# Patient Record
Sex: Female | Born: 1960 | Race: White | Hispanic: No | Marital: Married | Smoking: Former smoker
Health system: Southern US, Community
[De-identification: ages and names within clinical notes are randomized; demographics above are authoritative.]

## PROBLEM LIST (undated history)

## (undated) DIAGNOSIS — Z961 Presence of intraocular lens: Secondary | ICD-10-CM

## (undated) DIAGNOSIS — Z9849 Cataract extraction status, unspecified eye: Secondary | ICD-10-CM

## (undated) DIAGNOSIS — T8859XA Other complications of anesthesia, initial encounter: Secondary | ICD-10-CM

## (undated) DIAGNOSIS — R32 Unspecified urinary incontinence: Secondary | ICD-10-CM

## (undated) DIAGNOSIS — T7840XA Allergy, unspecified, initial encounter: Secondary | ICD-10-CM

## (undated) DIAGNOSIS — E785 Hyperlipidemia, unspecified: Secondary | ICD-10-CM

## (undated) DIAGNOSIS — K5790 Diverticulosis of intestine, part unspecified, without perforation or abscess without bleeding: Secondary | ICD-10-CM

## (undated) DIAGNOSIS — G35 Multiple sclerosis: Secondary | ICD-10-CM

## (undated) DIAGNOSIS — U071 COVID-19: Secondary | ICD-10-CM

## (undated) DIAGNOSIS — E559 Vitamin D deficiency, unspecified: Secondary | ICD-10-CM

## (undated) DIAGNOSIS — J309 Allergic rhinitis, unspecified: Secondary | ICD-10-CM

## (undated) HISTORY — DX: Diverticulosis of intestine, part unspecified, without perforation or abscess without bleeding: K57.90

## (undated) HISTORY — DX: Vitamin D deficiency, unspecified: E55.9

## (undated) HISTORY — DX: COVID-19: U07.1

## (undated) HISTORY — DX: Allergy, unspecified, initial encounter: T78.40XA

## (undated) HISTORY — DX: Unspecified urinary incontinence: R32

## (undated) HISTORY — PX: TUBAL LIGATION: SHX77

---

## 2000-02-08 ENCOUNTER — Other Ambulatory Visit: Admission: RE | Admit: 2000-02-08 | Discharge: 2000-02-08 | Payer: Self-pay | Admitting: Family Medicine

## 2004-11-03 HISTORY — PX: OTHER SURGICAL HISTORY: SHX169

## 2004-11-17 ENCOUNTER — Other Ambulatory Visit: Admission: RE | Admit: 2004-11-17 | Discharge: 2004-11-17 | Payer: Self-pay | Admitting: Obstetrics and Gynecology

## 2004-12-15 HISTORY — PX: ENDOMETRIAL BIOPSY: SHX622

## 2005-03-07 ENCOUNTER — Ambulatory Visit (HOSPITAL_BASED_OUTPATIENT_CLINIC_OR_DEPARTMENT_OTHER): Admission: RE | Admit: 2005-03-07 | Discharge: 2005-03-07 | Payer: Self-pay | Admitting: Obstetrics and Gynecology

## 2005-03-07 HISTORY — PX: ENDOMETRIAL ABLATION: SHX621

## 2006-04-07 ENCOUNTER — Other Ambulatory Visit: Admission: RE | Admit: 2006-04-07 | Discharge: 2006-04-07 | Payer: Self-pay | Admitting: Obstetrics and Gynecology

## 2006-11-04 HISTORY — PX: BREAST SURGERY: SHX581

## 2006-11-23 ENCOUNTER — Ambulatory Visit (HOSPITAL_BASED_OUTPATIENT_CLINIC_OR_DEPARTMENT_OTHER): Admission: RE | Admit: 2006-11-23 | Discharge: 2006-11-23 | Payer: Self-pay | Admitting: General Surgery

## 2006-11-23 ENCOUNTER — Encounter (INDEPENDENT_AMBULATORY_CARE_PROVIDER_SITE_OTHER): Payer: Self-pay | Admitting: General Surgery

## 2007-04-17 ENCOUNTER — Other Ambulatory Visit: Admission: RE | Admit: 2007-04-17 | Discharge: 2007-04-17 | Payer: Self-pay | Admitting: Obstetrics and Gynecology

## 2008-05-06 ENCOUNTER — Other Ambulatory Visit: Admission: RE | Admit: 2008-05-06 | Discharge: 2008-05-06 | Payer: Self-pay | Admitting: Obstetrics and Gynecology

## 2008-05-22 ENCOUNTER — Ambulatory Visit: Payer: Self-pay | Admitting: Internal Medicine

## 2008-06-03 LAB — HM COLONOSCOPY

## 2008-06-05 ENCOUNTER — Ambulatory Visit: Payer: Self-pay | Admitting: Internal Medicine

## 2009-05-13 LAB — HM PAP SMEAR

## 2010-05-18 NOTE — Op Note (Signed)
Gabrielle Patterson, Gabrielle Patterson               ACCOUNT NO.:  [id number]   MEDICAL RECORD NO.:  [id number]          PATIENT TYPE:  AMB   LOCATION:  [facility]SC                          FACILITY:  [facility]   PHYSICIAN:  Timothy E. Earlene Plater, M.D. DATE OF BIRTH:  December 06, 1960   DATE OF PROCEDURE:  11/23/2006  DATE OF DISCHARGE:                               OPERATIVE REPORT   PREOPERATIVE DIAGNOSIS:  Mass, right breast.   POSTOPERATIVE DIAGNOSIS:  Mass, right breast.   PROCEDURE:  Needle localization and excision of mass, right breast.   SURGEON:  Timothy E. Earlene Plater, M.D.   ANESTHESIA:  Local standby.   [name] is 39, on routine mammography, a questionable mass or  architectural distortion was found. She did have a stereotactic biopsy  that was thought discordant and a positive BSGI scan.  Therefore, an  open complete biopsy is recommended and she agrees.  The patient has  been to [facility] for the needle localization this morning and then was  seen, marked, and the permit signed.   She was taken to the operating room and placed supine.  IV sedation was  given.  The right breast was exposed.  The wire was cut to length.  It  entered the right breast at the 9 o'clock position and proceeded  medially.  To note, I had talked with Dr. Jackey Loge about this needle  localization because she actually had two clips, one superficial and one  deep.  The deep clip had been placed inadvertently. The superficial clip  was the area in question. So, 0.25% Marcaine with epinephrine was  applied to the skin after skin prep and drape.  Then, an incision was  made.  I followed the shaft of the wire through the subcutaneous fatty  tissue and when it entered the fibrous breast tissue, a clamp was  applied and a generous biopsy was made around the wire.  This specimen  was removed along with the entire wire as the wire did proceed beyond  the mass.  I did pull the wire out of the tissue, though I marked the  tissue and sent it for  x-ray confirmation.  Meanwhile, bleeding was  controlled with cautery.  The wound was closed in layers with Monocryl  and, finally, the skin.  Dr. Jackey Loge called and said that the specimen  was adequate and everything was included.  So, she is dismissed to the  recovery room to be followed as an outpatient.  All counts were correct.  She tolerated it well. Vicodin and instructions given.      Timothy E. Earlene Plater, M.D.  Electronically Signed     TED/MEDQ  D:  11/23/2006  T:  11/23/2006  Job:  [id number]   cc:   Edwena Felty. Romine, M.D.

## 2010-05-21 NOTE — Op Note (Signed)
[name], Gabrielle Patterson               ACCOUNT NO.:  [id number]   MEDICAL RECORD NO.:  [id number]          PATIENT TYPE:  AMB   LOCATION:  [facility]                         FACILITY:  [facility]   PHYSICIAN:  Cynthia P. Romine, M.D.DATE OF BIRTH:  09-Sep-1960   DATE OF PROCEDURE:  03/07/2005  DATE OF DISCHARGE:                                 OPERATIVE REPORT   PREOPERATIVE DIAGNOSIS:  Menorrhagia.   POSTOPERATIVE DIAGNOSIS:  Menorrhagia.   PROCEDURE:  Endometrial ablation using the hydrotherm ablation technique.   SURGEON:  Cynthia P. Romine, M.D.   ANESTHESIA:  General by LMA.   ESTIMATED BLOOD LOSS:  Minimal.   COMPLICATIONS:  None.   DESCRIPTION OF PROCEDURE:  The patient was taken to the operating room and  after the induction of adequate general anesthesia by LMA was placed in the  dorsal lithotomy position and prepped and draped in the usual fashion. The  bladder was drained with a red rubber catheter. A posterior weight and  anterior Simms retractor were placed and the cervix was grasped on its  anterior lip with a single tooth tenaculum. The uterus sounded to 8 cm. The  cervix was then dilated to a #23 Shawnie Pons. The hysteroscope was introduced,  photographic documentation was taken of the endometrial cavity before the  procedure. Documenting visualization of the tubal ostia, the scope was  withdrawn to just inside the internal os and endometrial ablation was  carried out according to the standard technique as recommended by the  manufacturer. After the ablation, photographic documentation was taken of  the cavity and of the endocervix. When the fluid was cooled, the soap was  removed, the instruments were removed from the vagina and the procedure was  terminated. The patient tolerated it well and went in satisfactory condition  to post anesthesia recovery.           ______________________________  Edwena Felty. Romine, M.D.     CPR/MEDQ  D:  03/07/2005  T:  03/08/2005  Job:   [id number]

## 2010-10-12 LAB — POCT HEMOGLOBIN-HEMACUE
Hemoglobin: 15
Operator id: 128471

## 2011-03-02 LAB — HM MAMMOGRAPHY

## 2011-07-12 ENCOUNTER — Ambulatory Visit: Payer: BC Managed Care – PPO | Attending: Sports Medicine | Admitting: Physical Therapy

## 2011-07-12 DIAGNOSIS — R5381 Other malaise: Secondary | ICD-10-CM | POA: Insufficient documentation

## 2011-07-12 DIAGNOSIS — IMO0001 Reserved for inherently not codable concepts without codable children: Secondary | ICD-10-CM | POA: Insufficient documentation

## 2011-07-12 DIAGNOSIS — M25539 Pain in unspecified wrist: Secondary | ICD-10-CM | POA: Insufficient documentation

## 2011-07-13 ENCOUNTER — Ambulatory Visit: Payer: BC Managed Care – PPO | Admitting: Physical Therapy

## 2011-07-18 ENCOUNTER — Ambulatory Visit: Payer: BC Managed Care – PPO | Admitting: Physical Therapy

## 2011-07-20 ENCOUNTER — Ambulatory Visit: Payer: BC Managed Care – PPO | Admitting: Physical Therapy

## 2011-07-25 ENCOUNTER — Encounter: Payer: BC Managed Care – PPO | Admitting: Physical Therapy

## 2011-11-25 ENCOUNTER — Other Ambulatory Visit: Payer: Self-pay | Admitting: Obstetrics and Gynecology

## 2011-11-25 ENCOUNTER — Ambulatory Visit (HOSPITAL_COMMUNITY)
Admission: RE | Admit: 2011-11-25 | Discharge: 2011-11-25 | Disposition: A | Discharge status: Home / Self Care | Payer: BC Managed Care – PPO | Source: Ambulatory Visit | Attending: Obstetrics and Gynecology | Admitting: Obstetrics and Gynecology

## 2011-11-25 DIAGNOSIS — R102 Pelvic and perineal pain: Secondary | ICD-10-CM

## 2011-11-25 DIAGNOSIS — M545 Low back pain, unspecified: Secondary | ICD-10-CM | POA: Insufficient documentation

## 2011-11-25 DIAGNOSIS — N912 Amenorrhea, unspecified: Secondary | ICD-10-CM | POA: Insufficient documentation

## 2011-11-25 DIAGNOSIS — N949 Unspecified condition associated with female genital organs and menstrual cycle: Secondary | ICD-10-CM | POA: Insufficient documentation

## 2012-07-13 DIAGNOSIS — R110 Nausea: Secondary | ICD-10-CM

## 2012-07-13 DIAGNOSIS — R42 Dizziness and giddiness: Secondary | ICD-10-CM

## 2012-08-07 ENCOUNTER — Other Ambulatory Visit (HOSPITAL_BASED_OUTPATIENT_CLINIC_OR_DEPARTMENT_OTHER): Payer: Self-pay | Admitting: Family Medicine

## 2012-08-07 ENCOUNTER — Ambulatory Visit (HOSPITAL_BASED_OUTPATIENT_CLINIC_OR_DEPARTMENT_OTHER)
Admission: RE | Admit: 2012-08-07 | Discharge: 2012-08-07 | Disposition: A | Discharge status: Home / Self Care | Payer: BC Managed Care – PPO | Source: Ambulatory Visit | Attending: Family Medicine | Admitting: Family Medicine

## 2012-08-07 ENCOUNTER — Ambulatory Visit (HOSPITAL_BASED_OUTPATIENT_CLINIC_OR_DEPARTMENT_OTHER): Payer: BC Managed Care – PPO

## 2012-08-07 DIAGNOSIS — R42 Dizziness and giddiness: Secondary | ICD-10-CM

## 2012-08-07 DIAGNOSIS — H9190 Unspecified hearing loss, unspecified ear: Secondary | ICD-10-CM | POA: Insufficient documentation

## 2012-08-07 DIAGNOSIS — R279 Unspecified lack of coordination: Secondary | ICD-10-CM | POA: Insufficient documentation

## 2012-08-07 DIAGNOSIS — H547 Unspecified visual loss: Secondary | ICD-10-CM | POA: Insufficient documentation

## 2012-08-07 MED ORDER — GADOBENATE DIMEGLUMINE 529 MG/ML IV SOLN
15.0000 mL | Freq: Once | INTRAVENOUS | Status: AC | PRN
  Administered 2012-08-07: 15 mL via INTRAVENOUS

## 2012-09-21 ENCOUNTER — Ambulatory Visit: Payer: Self-pay | Admitting: Obstetrics and Gynecology

## 2012-09-24 ENCOUNTER — Ambulatory Visit: Payer: Self-pay | Admitting: Obstetrics and Gynecology

## 2012-10-12 ENCOUNTER — Ambulatory Visit (INDEPENDENT_AMBULATORY_CARE_PROVIDER_SITE_OTHER): Payer: Managed Care, Other (non HMO) | Admitting: Obstetrics and Gynecology

## 2012-10-12 ENCOUNTER — Encounter: Payer: Self-pay | Admitting: Obstetrics and Gynecology

## 2012-10-12 VITALS — BP 120/64 | HR 80 | Temp 97.8°F | Ht 62.0 in | Wt 167.0 lb

## 2012-10-12 DIAGNOSIS — R635 Abnormal weight gain: Secondary | ICD-10-CM

## 2012-10-12 DIAGNOSIS — Z23 Encounter for immunization: Secondary | ICD-10-CM

## 2012-10-12 DIAGNOSIS — E559 Vitamin D deficiency, unspecified: Secondary | ICD-10-CM

## 2012-10-12 DIAGNOSIS — Z0000 Encounter for general adult medical examination without abnormal findings: Secondary | ICD-10-CM

## 2012-10-12 DIAGNOSIS — Z01419 Encounter for gynecological examination (general) (routine) without abnormal findings: Secondary | ICD-10-CM

## 2012-10-12 LAB — COMPREHENSIVE METABOLIC PANEL
Albumin: 4.5 g/dL (ref 3.5–5.2)
BUN: 15 mg/dL (ref 6–23)
Calcium: 9.7 mg/dL (ref 8.4–10.5)
Chloride: 102 mEq/L (ref 96–112)
Glucose, Bld: 82 mg/dL (ref 70–99)
Potassium: 4.8 mEq/L (ref 3.5–5.3)
Sodium: 137 mEq/L (ref 135–145)
Total Protein: 7.1 g/dL (ref 6.0–8.3)

## 2012-10-12 LAB — LIPID PANEL
LDL Cholesterol: 130 mg/dL — ABNORMAL HIGH (ref 0–99)
Triglycerides: 115 mg/dL (ref <150)
VLDL: 23 mg/dL (ref 0–40)

## 2012-10-12 LAB — POCT URINALYSIS DIPSTICK
Blood, UA: NEGATIVE
Nitrite, UA: NEGATIVE
Protein, UA: NEGATIVE
Urobilinogen, UA: NEGATIVE
pH, UA: 5

## 2012-10-12 LAB — CBC
HCT: 44.5 % (ref 36.0–46.0)
Hemoglobin: 15.1 g/dL — ABNORMAL HIGH (ref 12.0–15.0)
MCHC: 33.9 g/dL (ref 30.0–36.0)
MCV: 88.8 fL (ref 78.0–100.0)
RBC: 5.01 MIL/uL (ref 3.87–5.11)
WBC: 6.3 10*3/uL (ref 4.0–10.5)

## 2012-10-12 LAB — TSH: TSH: 1.217 u[IU]/mL (ref 0.350–4.500)

## 2012-10-12 NOTE — Patient Instructions (Signed)
EXERCISE AND DIET:  We recommended that you start or continue a regular exercise program for good health. Regular exercise means any activity that makes your heart beat faster and makes you sweat.  We recommend exercising at least 30 minutes per day at least 3 days a week, preferably 4 or 5.  We also recommend a diet low in fat and sugar.  Inactivity, poor dietary choices and obesity can cause diabetes, heart attack, stroke, and kidney damage, among others.    ALCOHOL AND SMOKING:  Women should limit their alcohol intake to no more than 7 drinks/beers/glasses of wine (combined, not each!) per week. Moderation of alcohol intake to this level decreases your risk of breast cancer and liver damage. And of course, no recreational drugs are part of a healthy lifestyle.  And absolutely no smoking or even second hand smoke. Most people know smoking can cause heart and lung diseases, but did you know it also contributes to weakening of your bones? Aging of your skin?  Yellowing of your teeth and nails?  CALCIUM AND VITAMIN D:  Adequate intake of calcium and Vitamin D are recommended.  The recommendations for exact amounts of these supplements seem to change often, but generally speaking 600 mg of calcium (either carbonate or citrate) and 800 units of Vitamin D per day seems prudent. Certain women may benefit from higher intake of Vitamin D.  If you are among these women, your doctor will have told you during your visit.    PAP SMEARS:  Pap smears, to check for cervical cancer or precancers,  have traditionally been done yearly, although recent scientific advances have shown that most women can have pap smears less often.  However, every woman still should have a physical exam from her gynecologist every year. It will include a breast check, inspection of the vulva and vagina to check for abnormal growths or skin changes, a visual exam of the cervix, and then an exam to evaluate the size and shape of the uterus and  ovaries.  And after 54 years of age, a rectal exam is indicated to check for rectal cancers. We will also provide age appropriate advice regarding health maintenance, like when you should have certain vaccines, screening for sexually transmitted diseases, bone density testing, colonoscopy, mammograms, etc.   MAMMOGRAMS:  All women over 40 years old should have a yearly mammogram. Many facilities now offer a "3D" mammogram, which may cost around $50 extra out of pocket. If possible,  we recommend you accept the option to have the 3D mammogram performed.  It both reduces the number of women who will be called back for extra views which then turn out to be normal, and it is better than the routine mammogram at detecting truly abnormal areas.    COLONOSCOPY:  Colonoscopy to screen for colon cancer is recommended for all women at age 50.  We know, you hate the idea of the prep.  We agree, BUT, having colon cancer and not knowing it is worse!!  Colon cancer so often starts as a polyp that can be seen and removed at colonscopy, which can quite literally save your life!  And if your first colonoscopy is normal and you have no family history of colon cancer, most women don't have to have it again for 10 years.  Once every ten years, you can do something that may end up saving your life, right?  We will be happy to help you get it scheduled when you are ready.    Be sure to check your insurance coverage so you understand how much it will cost.  It may be covered as a preventative service at no cost, but you should check your particular policy.    Tetanus, Diphtheria, Pertussis (Tdap) Vaccine What You Need to Know WHY GET VACCINATED? Tetanus, diphtheria and pertussis can be very serious diseases, even for adolescents and adults. Tdap vaccine can protect us from these diseases. TETANUS (Lockjaw) causes painful muscle tightening and stiffness, usually all over the body.  It can lead to tightening of muscles in the head  and neck so you can't open your mouth, swallow, or sometimes even breathe. Tetanus kills about 1 out of 5 people who are infected. DIPHTHERIA can cause a thick coating to form in the back of the throat.  It can lead to breathing problems, paralysis, heart failure, and death. PERTUSSIS (Whooping Cough) causes severe coughing spells, which can cause difficulty breathing, vomiting and disturbed sleep.  It can also lead to weight loss, incontinence, and rib fractures. Up to 2 in 100 adolescents and 5 in 100 adults with pertussis are hospitalized or have complications, which could include pneumonia and death. These diseases are caused by bacteria. Diphtheria and pertussis are spread from person to person through coughing or sneezing. Tetanus enters the body through cuts, scratches, or wounds. Before vaccines, the [place] saw as many as 200,000 cases a year of diphtheria and pertussis, and hundreds of cases of tetanus. Since vaccination began, tetanus and diphtheria have dropped by about 99% and pertussis by about 80%. TDAP VACCINE Tdap vaccine can protect adolescents and adults from tetanus, diphtheria, and pertussis. One dose of Tdap is routinely given at age 11 or 12. People who did not get Tdap at that age should get it as soon as possible. Tdap is especially important for health care professionals and anyone having close contact with a baby younger than 12 months. Pregnant women should get a dose of Tdap during every pregnancy, to protect the newborn from pertussis. Infants are most at risk for severe, life-threatening complications from pertussis. A similar vaccine, called Td, protects from tetanus and diphtheria, but not pertussis. A Td booster should be given every 10 years. Tdap may be given as one of these boosters if you have not already gotten a dose. Tdap may also be given after a severe cut or burn to prevent tetanus infection. Your doctor can give you more information. Tdap may safely  be given at the same time as other vaccines. SOME PEOPLE SHOULD NOT GET THIS VACCINE  If you ever had a life-threatening allergic reaction after a dose of any tetanus, diphtheria, or pertussis containing vaccine, OR if you have a severe allergy to any part of this vaccine, you should not get Tdap. Tell your doctor if you have any severe allergies.  If you had a coma, or long or multiple seizures within 7 days after a childhood dose of DTP or DTaP, you should not get Tdap, unless a cause other than the vaccine was found. You can still get Td.  Talk to your doctor if you:  have epilepsy or another nervous system problem,  had severe pain or swelling after any vaccine containing diphtheria, tetanus or pertussis,  ever had Guillain-Barr Syndrome (GBS),  aren't feeling well on the day the shot is scheduled. RISKS OF A VACCINE REACTION With any medicine, including vaccines, there is a chance of side effects. These are usually mild and go away on their own, but serious   reactions are also possible. Brief fainting spells can follow a vaccination, leading to injuries from falling. Sitting or lying down for about 15 minutes can help prevent these. Tell your doctor if you feel dizzy or light-headed, or have vision changes or ringing in the ears. Mild problems following Tdap (Did not interfere with activities)  Pain where the shot was given (about 3 in 4 adolescents or 2 in 3 adults)  Redness or swelling where the shot was given (about 1 person in 5)  Mild fever of at least 100.4F (up to about 1 in 25 adolescents or 1 in 100 adults)  Headache (about 3 or 4 people in 10)  Tiredness (about 1 person in 3 or 4)  Nausea, vomiting, diarrhea, stomach ache (up to 1 in 4 adolescents or 1 in 10 adults)  Chills, body aches, sore joints, rash, swollen glands (uncommon) Moderate problems following Tdap (Interfered with activities, but did not require medical attention)  Pain where the shot was given  (about 1 in 5 adolescents or 1 in 100 adults)  Redness or swelling where the shot was given (up to about 1 in 16 adolescents or 1 in 25 adults)  Fever over 102F (about 1 in 100 adolescents or 1 in 250 adults)  Headache (about 3 in 20 adolescents or 1 in 10 adults)  Nausea, vomiting, diarrhea, stomach ache (up to 1 or 3 people in 100)  Swelling of the entire arm where the shot was given (up to about 3 in 100). Severe problems following Tdap (Unable to perform usual activities, required medical attention)  Swelling, severe pain, bleeding and redness in the arm where the shot was given (rare). A severe allergic reaction could occur after any vaccine (estimated less than 1 in a million doses). WHAT IF THERE IS A SERIOUS REACTION? What should I look for?  Look for anything that concerns you, such as signs of a severe allergic reaction, very high fever, or behavior changes. Signs of a severe allergic reaction can include hives, swelling of the face and throat, difficulty breathing, a fast heartbeat, dizziness, and weakness. These would start a few minutes to a few hours after the vaccination. What should I do?  If you think it is a severe allergic reaction or other emergency that can't wait, call 9-1-1 or get the person to the nearest hospital. Otherwise, call your doctor.  Afterward, the reaction should be reported to the "Vaccine Adverse Event Reporting System" (VAERS). Your doctor might file this report, or you can do it yourself through the VAERS web site at www.vaers.hhs.gov, or by calling 1-800-822-7967. VAERS is only for reporting reactions. They do not give medical advice.  THE NATIONAL VACCINE INJURY COMPENSATION PROGRAM The National Vaccine Injury Compensation Program (VICP) is a federal program that was created to compensate people who may have been injured by certain vaccines. Persons who believe they may have been injured by a vaccine can learn about the program and about filing a  claim by calling 1-800-338-2382 or visiting the VICP website at www.hrsa.gov/vaccinecompensation. HOW CAN I LEARN MORE?  Ask your doctor.  Call your local or state health department.  Contact the Centers for Disease Control and Prevention (CDC):  Call 1-800-232-4636 or visit CDC's website at www.cdc.gov/vaccines. CDC Tdap Vaccine VIS (05/12/11) Document Released: 06/21/2011 Document Revised: 09/14/2011 Document Reviewed: 06/21/2011 Exit[employer] Patient Information 2014 [employer].  

## 2012-10-12 NOTE — Progress Notes (Signed)
Patient ID: Gabrielle Patterson, female   DOB: 1960/08/09, 52 y.o.   MRN: [id number] GYNECOLOGY VISIT  PCP:  Marinda Elk, MD  Referring provider:   HPI: 52 y.o.   Married  Asian  female   [phone] with Patient's last menstrual period was 01/03/2009.   here for  AEX.   Difficulty with weight loss.  Patient ran into a closet door.  Diagnosed with a concussion.   Infected sebaceous cyst of left leg.  Taking antibiotic.   Hgb:    14.6 Urine:  Neg  GYNECOLOGIC HISTORY: Patient's last menstrual period was 01/03/2009. Sexually active:  yes Partner preference: female Contraception:   tubal Menopausal hormone therapy: no DES exposure:   no Blood transfusions:   no Sexually transmitted diseases:   no GYN Procedures:  Tubal ligation, ablation. Mammogram: 02-20-12 JYN:WGNFA                 Pap: 05-13-09 wnl   History of abnormal pap smear:  no   OB History   Grav Para Term Preterm Abortions TAB SAB Ect Mult Living   3 2 2  1  1   2        LIFESTYLE: Exercise:    no         Tobacco:    no Alcohol:       socially Drug use:    no  OTHER HEALTH MAINTENANCE: Tetanus/TDap:   unsure Gardisil:  NA Influenza:  09/2012 Zostavax:  NA  Bone density:  never Colonoscopy:  2010 within:next colonoscopy due 2020  Cholesterol check:  10-08-2012 wnl with PCP  Family History  Problem Relation Age of Onset  . Diabetes Mother   . Hypertension Mother   . Hyperlipidemia Mother   . Breast cancer Maternal Grandmother   . Ovarian cancer Paternal Grandmother   . Diabetes Paternal Grandmother   . Diabetes Father   . Hypertension Father   . Stroke Father   . Thyroid disease Sister     There are no active problems to display for this patient.  Past Medical History  Diagnosis Date  . Restless leg syndrome   . Diverticulosis     Past Surgical History  Procedure Laterality Date  . Breast surgery  11/08    Rt.breast Bx--Florid fibrocystic changes--high risk but negative for neoplasm  . Throat  tumor  11/06    --benign  . Endometrial biopsy  12-15-04    --benign endocervical polyp--Dr. Tresa Res  . Tubal ligation    . Endometrial ablation  03-07-05    -HTA    ALLERGIES: Penicillins  Current Outpatient Prescriptions  Medication Sig Dispense Refill  . sulfamethoxazole-trimethoprim (BACTRIM DS) 800-160 MG per tablet Take 1 tablet by mouth 2 (two) times daily.      . Multiple Vitamin (MULTIVITAMIN) capsule Take 1 capsule by mouth daily.      . Vitamin D, Ergocalciferol, (DRISDOL) 50000 UNITS CAPS capsule Take 50,000 Units by mouth every 7 (seven) days.       No current facility-administered medications for this visit.     ROS:  Pertinent items are noted in HPI.  SOCIAL HISTORY:  Programmer, applications - self employed.  2 children - grown.   PHYSICAL EXAMINATION:    BP 120/64  Pulse 80  Temp(Src) 97.8 F (36.6 C) (Oral)  Ht 5\' 2"  (1.575 m)  Wt 167 lb (75.751 kg)  BMI 30.54 kg/m2  LMP 01/03/2009   Wt Readings from Last 3 Encounters:  10/12/12 167 lb (75.751 kg)  Ht Readings from Last 3 Encounters:  10/12/12 5\' 2"  (1.575 m)    General appearance: alert, cooperative and appears stated age Head: Normocephalic, without obvious abnormality, atraumatic Neck: no adenopathy, supple, symmetrical, trachea midline and thyroid not enlarged, symmetric, no tenderness/mass/nodules Lungs: clear to auscultation bilaterally Breasts: Inspection negative, Scar of right lateral breast, No nipple retraction or dimpling, No nipple discharge or bleeding, No axillary or supraclavicular adenopathy, Normal to palpation without dominant masses.  Slight inversion of left nipple. (Not new per patient.) Heart: regular rate and rhythm Abdomen: soft, non-tender; no masses,  no organomegaly Extremities: extremities normal, atraumatic, no cyanosis or edema Skin: Skin color, texture, turgor normal. No rashes.  Left lateral thigh with 1.5 cm fluctuant area with erythema, nontender. Lymph nodes: Cervical,  supraclavicular, and axillary nodes normal. No abnormal inguinal nodes palpated Neurologic: Grossly normal  Pelvic: External genitalia:  no lesions              Urethra:  normal appearing urethra with no masses, tenderness or lesions              Bartholins and Skenes: normal                 Vagina: normal appearing vagina with normal color and discharge, no lesions              Cervix: normal appearance              Pap and high risk HPV testing done: yes.            Bimanual Exam:  Uterus:  uterus is normal size, shape, consistency and nontender                                      Adnexa: normal adnexa in size, nontender and no masses                                      Rectovaginal: Confirms                                      Anus:  normal sphincter tone, no lesions  ASSESSMENT  Normal gynecologic exam. Weight gain. Vit D deficiency Skin abscess, on Bactrim.  PLAN  Mammogram yearly. Pap smear and high risk HPV testing Lipid profile, CMP, CBC, TSH, Vit D TDap today Return annually or prn   An After Visit Summary was printed and given to the patient.

## 2012-10-13 ENCOUNTER — Other Ambulatory Visit: Payer: Self-pay | Admitting: Obstetrics and Gynecology

## 2012-10-13 LAB — VITAMIN D 25 HYDROXY (VIT D DEFICIENCY, FRACTURES): Vit D, 25-Hydroxy: 35 ng/mL (ref 30–89)

## 2012-10-13 MED ORDER — VITAMIN D (ERGOCALCIFEROL) 1.25 MG (50000 UNIT) PO CAPS: 50000.0000 [IU] | ORAL_CAPSULE | ORAL | Status: DC

## 2012-10-15 LAB — HEMOGLOBIN, FINGERSTICK: Hemoglobin, fingerstick: 14.6 g/dL (ref 12.0–16.0)

## 2012-11-08 ENCOUNTER — Other Ambulatory Visit: Payer: Self-pay

## 2013-09-07 ENCOUNTER — Encounter: Payer: Self-pay | Admitting: Internal Medicine

## 2013-10-14 ENCOUNTER — Telehealth: Payer: Self-pay | Admitting: Obstetrics and Gynecology

## 2013-10-14 ENCOUNTER — Ambulatory Visit: Payer: Managed Care, Other (non HMO) | Admitting: Obstetrics and Gynecology

## 2013-10-14 NOTE — Telephone Encounter (Signed)
Thank you :)

## 2013-10-14 NOTE — Telephone Encounter (Signed)
Patient came in for her visit for AEX with Dr. Edward Jolly today but did not have her new insurance card. She rescheduled for 12/13/13. FYI only.

## 2013-11-04 ENCOUNTER — Encounter: Payer: Self-pay | Admitting: Obstetrics and Gynecology

## 2013-12-13 ENCOUNTER — Ambulatory Visit (INDEPENDENT_AMBULATORY_CARE_PROVIDER_SITE_OTHER): Payer: PRIVATE HEALTH INSURANCE | Admitting: Obstetrics and Gynecology

## 2013-12-13 ENCOUNTER — Encounter: Payer: Self-pay | Admitting: Obstetrics and Gynecology

## 2013-12-13 VITALS — BP 122/84 | HR 66 | Resp 16 | Ht 62.0 in | Wt 164.4 lb

## 2013-12-13 DIAGNOSIS — R5383 Other fatigue: Secondary | ICD-10-CM

## 2013-12-13 DIAGNOSIS — Z0000 Encounter for general adult medical examination without abnormal findings: Secondary | ICD-10-CM

## 2013-12-13 DIAGNOSIS — Z01419 Encounter for gynecological examination (general) (routine) without abnormal findings: Secondary | ICD-10-CM

## 2013-12-13 LAB — LIPID PANEL
CHOL/HDL RATIO: 3.2 ratio
CHOLESTEROL: 186 mg/dL (ref 0–200)
HDL: 58 mg/dL (ref >39)
LDL CALC: 101 mg/dL — AB (ref 0–99)
TRIGLYCERIDES: 134 mg/dL (ref <150)
VLDL: 27 mg/dL (ref 0–40)

## 2013-12-13 LAB — POCT URINALYSIS DIPSTICK
BILIRUBIN UA: NEGATIVE
Blood, UA: NEGATIVE
GLUCOSE UA: NEGATIVE
Ketones, UA: NEGATIVE
Leukocytes, UA: NEGATIVE
NITRITE UA: NEGATIVE
PH UA: 5
Protein, UA: NEGATIVE
UROBILINOGEN UA: NEGATIVE

## 2013-12-13 LAB — CBC
HCT: 42.9 % (ref 36.0–46.0)
HEMOGLOBIN: 14.1 g/dL (ref 12.0–15.0)
MCH: 29.3 pg (ref 26.0–34.0)
MCHC: 32.9 g/dL (ref 30.0–36.0)
MCV: 89 fL (ref 78.0–100.0)
MPV: 11.1 fL (ref 9.4–12.4)
Platelets: 281 10*3/uL (ref 150–400)
RBC: 4.82 MIL/uL (ref 3.87–5.11)
RDW: 12.6 % (ref 11.5–15.5)
WBC: 6.8 10*3/uL (ref 4.0–10.5)

## 2013-12-13 LAB — COMPREHENSIVE METABOLIC PANEL
ALK PHOS: 84 U/L (ref 39–117)
ALT: 20 U/L (ref 0–35)
AST: 17 U/L (ref 0–37)
Albumin: 4.2 g/dL (ref 3.5–5.2)
BUN: 17 mg/dL (ref 6–23)
CO2: 28 mEq/L (ref 19–32)
CREATININE: 0.7 mg/dL (ref 0.50–1.10)
Calcium: 9.4 mg/dL (ref 8.4–10.5)
Chloride: 102 mEq/L (ref 96–112)
Glucose, Bld: 85 mg/dL (ref 70–99)
Potassium: 4.3 mEq/L (ref 3.5–5.3)
SODIUM: 140 meq/L (ref 135–145)
TOTAL PROTEIN: 6.6 g/dL (ref 6.0–8.3)
Total Bilirubin: 0.4 mg/dL (ref 0.2–1.2)

## 2013-12-13 LAB — VITAMIN B12: Vitamin B-12: 1748 pg/mL — ABNORMAL HIGH (ref 211–911)

## 2013-12-13 LAB — TSH: TSH: 1.191 u[IU]/mL (ref 0.350–4.500)

## 2013-12-13 LAB — HEMOGLOBIN, FINGERSTICK: Hemoglobin, fingerstick: 14 g/dL (ref 12.0–16.0)

## 2013-12-13 NOTE — Progress Notes (Signed)
Patient ID: Gabrielle Patterson, female   DOB: April 18, 1960, 53 y.o.   MRN: [id number] 53 y.o. [id number] MarriedCaucasianF here for annual exam.   Stopped drinking coffee and long standing bladder pain resolved.   Taking Vit B12 injections and had weight loss.  Lost 16 pounds.  Some fatigue.  Mother being treated for pancreatic cancer.  Mother shutting everyone out.    PCP:  Marinda Elk, MD   Patient's last menstrual period was 01/03/2009.          Sexually active: Yes.  female  The current method of family planning is tubal ligation/ablation.    Exercising: Yes.    treadmill and stationary bike. Smoker:  no  Health Maintenance: Pap:  10-12-12 wnl:neg HR HPV History of abnormal Pap:  Yes, Had cryotherapy to cervix in her 20's--paps normal since. MMG:  03-01-13 fibroglandular density/nl/bx clip right breast:[facility] Colonoscopy:  2010.  Next colonoscopy due 2020. BMD:   --- TDaP:  10/2012 Screening Labs:   Hb today: 14.0, Urine today: Neg   reports that she has been smoking.  She does not have any smokeless tobacco history on file. She reports that she drinks alcohol. She reports that she does not use illicit drugs.  Past Medical History  Diagnosis Date  . Restless leg syndrome   . Diverticulosis     Past Surgical History  Procedure Laterality Date  . Breast surgery  11/08    Rt.breast Bx--Florid fibrocystic changes--high risk but negative for neoplasm  . 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 Endometrial biopsy  12-15-04    --benign endocervical polyp--Dr. Tresa Res  . Tubal ligation    . Endometrial ablation  03-07-05    -HTA    No current outpatient prescriptions on file.   No current facility-administered medications for this visit.    Family History  Problem Relation Age of Onset  . Diabetes Mother   . Hypertension Mother   . Hyperlipidemia Mother   . Cancer Mother 91    pancreatic ca  . Breast cancer Maternal Grandmother   . Ovarian cancer Paternal Grandmother   .  Diabetes Paternal Grandmother   . Diabetes Father   . Hypertension Father   . Stroke Father   . Thyroid disease Sister     ROS:  Pertinent items are noted in HPI.  Otherwise, a comprehensive ROS was negative.  Exam:   BP 122/84 mmHg  Pulse 66  Resp 16  Ht 5\' 2"  (1.575 m)  Wt 164 lb 6.4 oz (74.571 kg)  BMI 30.06 kg/m2  LMP 01/03/2009     Height: 5\' 2"  (157.5 cm)  Ht Readings from Last 3 Encounters:  12/13/13 5\' 2"  (1.575 m)  10/12/12 5\' 2"  (1.575 m)    General appearance: alert, cooperative and appears stated age Head: Normocephalic, without obvious abnormality, atraumatic Neck: no adenopathy, supple, symmetrical, trachea midline and thyroid normal to inspection and palpation Lungs: clear to auscultation bilaterally Breasts: normal appearance, no masses or tenderness, Inspection negative, No nipple retraction or dimpling, No nipple discharge or bleeding, No axillary or supraclavicular adenopathy,  Has slight left nipple inversion (old change.) Heart: regular rate and rhythm Abdomen: soft, non-tender; bowel sounds normal; no masses,  no organomegaly Extremities: extremities normal, atraumatic, no cyanosis or edema Skin: Skin color, texture, turgor normal. No rashes or lesions Lymph nodes: Cervical, supraclavicular, and axillary nodes normal. No abnormal inguinal nodes palpated Neurologic: Grossly normal   Pelvic: External genitalia:  no lesions  Urethra:  normal appearing urethra with no masses, tenderness or lesions              Bartholins and Skenes: normal                 Vagina: normal appearing vagina with normal color and discharge, no lesions              Cervix: no lesions              Pap taken: No. Bimanual Exam:  Uterus:  normal size, contour, position, consistency, mobility, non-tender              Adnexa: normal adnexa and no mass, fullness, tenderness               Rectovaginal: Confirms               Anus:  normal sphincter tone, no lesions  A:   Well Woman with normal exam Remote history of abnormal pap and cryotherapy.  Normal pap last year. Fatigue.  Elevated LDL cholesterol.   P:   Mammogram yearly.  pap smear in 2017.  Routine labs.  TSH and Vit B12.  I discussed weight loss through healthy diet and exercise.  Support given regarding mother.  I encouraged counseling for the family.  return annually or prn  An After Visit Summary was printed and given to the patient.

## 2013-12-13 NOTE — Patient Instructions (Signed)

## 2015-01-07 ENCOUNTER — Ambulatory Visit: Payer: PRIVATE HEALTH INSURANCE | Admitting: Obstetrics and Gynecology

## 2015-01-19 ENCOUNTER — Encounter: Payer: Self-pay | Admitting: Obstetrics and Gynecology

## 2015-01-19 ENCOUNTER — Ambulatory Visit (INDEPENDENT_AMBULATORY_CARE_PROVIDER_SITE_OTHER): Payer: PRIVATE HEALTH INSURANCE | Admitting: Obstetrics and Gynecology

## 2015-01-19 VITALS — BP 120/82 | HR 70 | Resp 16 | Ht 62.0 in | Wt 175.8 lb

## 2015-01-19 DIAGNOSIS — Z01419 Encounter for gynecological examination (general) (routine) without abnormal findings: Secondary | ICD-10-CM | POA: Diagnosis not present

## 2015-01-19 DIAGNOSIS — Z0000 Encounter for general adult medical examination without abnormal findings: Secondary | ICD-10-CM

## 2015-01-19 LAB — CBC
HCT: 44.4 % (ref 36.0–46.0)
Hemoglobin: 15 g/dL (ref 12.0–15.0)
MCH: 30.2 pg (ref 26.0–34.0)
MCHC: 33.8 g/dL (ref 30.0–36.0)
MCV: 89.3 fL (ref 78.0–100.0)
MPV: 11.2 fL (ref 8.6–12.4)
PLATELETS: 263 10*3/uL (ref 150–400)
RBC: 4.97 MIL/uL (ref 3.87–5.11)
RDW: 13.1 % (ref 11.5–15.5)
WBC: 7.2 10*3/uL (ref 4.0–10.5)

## 2015-01-19 LAB — COMPREHENSIVE METABOLIC PANEL
ALK PHOS: 85 U/L (ref 33–130)
ALT: 17 U/L (ref 6–29)
AST: 15 U/L (ref 10–35)
Albumin: 4.3 g/dL (ref 3.6–5.1)
BUN: 17 mg/dL (ref 7–25)
CO2: 29 mmol/L (ref 20–31)
CREATININE: 0.66 mg/dL (ref 0.50–1.05)
Calcium: 9.9 mg/dL (ref 8.6–10.4)
Chloride: 100 mmol/L (ref 98–110)
GLUCOSE: 82 mg/dL (ref 65–99)
Potassium: 4.3 mmol/L (ref 3.5–5.3)
SODIUM: 137 mmol/L (ref 135–146)
TOTAL PROTEIN: 7.1 g/dL (ref 6.1–8.1)
Total Bilirubin: 0.3 mg/dL (ref 0.2–1.2)

## 2015-01-19 LAB — POCT URINALYSIS DIPSTICK
Bilirubin, UA: NEGATIVE
Blood, UA: NEGATIVE
Glucose, UA: NEGATIVE
KETONES UA: NEGATIVE
LEUKOCYTES UA: NEGATIVE
Nitrite, UA: NEGATIVE
PH UA: 5
Protein, UA: NEGATIVE
UROBILINOGEN UA: NEGATIVE

## 2015-01-19 LAB — TSH: TSH: 1.02 u[IU]/mL (ref 0.350–4.500)

## 2015-01-19 LAB — LIPID PANEL
Cholesterol: 201 mg/dL — ABNORMAL HIGH (ref 125–200)
HDL: 61 mg/dL (ref >=46)
LDL CALC: 122 mg/dL (ref <130)
Total CHOL/HDL Ratio: 3.3 Ratio (ref <=5.0)
Triglycerides: 92 mg/dL (ref <150)
VLDL: 18 mg/dL (ref <30)

## 2015-01-19 LAB — VITAMIN D 25 HYDROXY (VIT D DEFICIENCY, FRACTURES): VIT D 25 HYDROXY: 25 ng/mL — AB (ref 30–100)

## 2015-01-19 NOTE — Progress Notes (Signed)
Patient ID: Gabrielle Patterson, female   DOB: 1960-12-23, 55 y.o.   MRN: [id number] 55 y.o. [id number] Married Caucasian female here for annual exam.    Some hot flashes that have just increased again the last month.  They seem to come and go.  Not sleeping well at night due to night sweats.  Some emotional changes also.   Mother with pancreatic cancer.  Dealing with a lot of pain.  Patients siblings are causing stress for her.  Patient has done some counseling.   PCP:   Betty [place], MD  Patient's last menstrual period was 01/03/2009.          Sexually active: Yes.   female The current method of family planning is Tubal/post menopausal status.    Exercising: No.   Smoker:  Former, quit 2007   Health Maintenance: Pap:  10-12-12 Neg:Neg HR HPV History of abnormal Pap:  Yes, Hx cryotherapy to cervix in her 20's--paps normal since. MMG: 03-19-14 3D/scattered fibroglandular density/BiRads[facility]. Colonoscopy:  06-05-08 moderate diverticulosis;next due 06/2018 with Dr. Marina Goodell. BMD:   n/a  Result  n/a TDaP:  10/2012 Screening Labs:  Urine today:  ---   reports that she has been smoking.  She does not have any smokeless tobacco history on file. She reports that she drinks alcohol. She reports that she does not use illicit drugs.  Past Medical History  Diagnosis Date  . Restless leg syndrome   . Diverticulosis     Past Surgical History  Procedure Laterality Date  . Breast surgery  11/08    Rt.breast Bx--Florid fibrocystic changes--high risk but negative for neoplasm  . 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 Endometrial biopsy  12-15-04    --benign endocervical polyp--Dr. Tresa Res  . Tubal ligation    . Endometrial ablation  03-07-05    -HTA    No current outpatient prescriptions on file.   No current facility-administered medications for this visit.    Family History  Problem Relation Age of Onset  . Diabetes Mother   . Hypertension Mother   . Hyperlipidemia Mother   . Cancer Mother 66     pancreatic ca  . Breast cancer Maternal Grandmother   . Ovarian cancer Paternal Grandmother   . Diabetes Paternal Grandmother   . Diabetes Father   . Hypertension Father   . Stroke Father   . Thyroid disease Sister     ROS:  Pertinent items are noted in HPI.  Otherwise, a comprehensive ROS was negative.  Exam:   BP 120/82 mmHg  Pulse 70  Resp 16  Ht  (1.575 m)  Wt 175 lb 12.8 oz (79.742 kg)  BMI 32.15 kg/m2  LMP 01/03/2009    General appearance: alert, cooperative and appears stated age Head: Normocephalic, without obvious abnormality, atraumatic Neck: no adenopathy, supple, symmetrical, trachea midline and thyroid normal to inspection and palpation Lungs: clear to auscultation bilaterally Breasts: normal appearance, no masses or tenderness, Inspection negative, No nipple retraction or dimpling, No nipple discharge or bleeding, No axillary or supraclavicular adenopathy Heart: regular rate and rhythm Abdomen: soft, non-tender; bowel sounds normal; no masses,  no organomegaly Extremities: extremities normal, atraumatic, no cyanosis or edema Skin: Skin color, texture, turgor normal. No rashes or lesions Lymph nodes: Cervical, supraclavicular, and axillary nodes normal. No abnormal inguinal nodes palpated Neurologic: Grossly normal  Pelvic: External genitalia:  no lesions              Urethra:  normal appearing urethra  with no masses, tenderness or lesions              Bartholins and Skenes: normal                 Vagina: normal appearing vagina with normal color and discharge, no lesions              Cervix: no lesions              Pap taken: Yes.   Bimanual Exam:  Uterus:  normal size, contour, position, consistency, mobility, non-tender              Adnexa: normal adnexa and no mass, fullness, tenderness              Rectovaginal: Yes.  .  Confirms.              Anus:  normal sphincter tone, no lesions  Chaperone was present for exam.  Assessment:   Well woman  visit with normal exam. Remote hx of abnormal pap.  Hx of endometrial ablation.  Menopausal symptoms.   Plan: Yearly mammogram recommended after age 49.  Recommended self breast exam.  Pap and HR HPV as above. Discussed Calcium, Vitamin D, regular exercise program including cardiovascular and weight bearing exercise. Labs performed.  Yes.  .   See orders.  Routine labs.  Refills given on medications.  No..   Discussed HRT, Effexor/Brisdelle/Paxil, and herbal remedies.  Chooses herbal options.  Follow up annually and prn.      After visit summary provided.

## 2015-01-19 NOTE — Patient Instructions (Addendum)
EXERCISE AND DIET:  We recommended that you start or continue a regular exercise program for good health. Regular exercise means any activity that makes your heart beat faster and makes you sweat.  We recommend exercising at least 30 minutes per day at least 3 days a week, preferably 4 or 5.  We also recommend a diet low in fat and sugar.  Inactivity, poor dietary choices and obesity can cause diabetes, heart attack, stroke, and kidney damage, among others.    ALCOHOL AND SMOKING:  Women should limit their alcohol intake to no more than 7 drinks/beers/glasses of wine (combined, not each!) per week. Moderation of alcohol intake to this level decreases your risk of breast cancer and liver damage. And of course, no recreational drugs are part of a healthy lifestyle.  And absolutely no smoking or even second hand smoke. Most people know smoking can cause heart and lung diseases, but did you know it also contributes to weakening of your bones? Aging of your skin?  Yellowing of your teeth and nails?  CALCIUM AND VITAMIN D:  Adequate intake of calcium and Vitamin D are recommended.  The recommendations for exact amounts of these supplements seem to change often, but generally speaking 600 mg of calcium (either carbonate or citrate) and 800 units of Vitamin D per day seems prudent. Certain women may benefit from higher intake of Vitamin D.  If you are among these women, your doctor will have told you during your visit.    PAP SMEARS:  Pap smears, to check for cervical cancer or precancers,  have traditionally been done yearly, although recent scientific advances have shown that most women can have pap smears less often.  However, every woman still should have a physical exam from her gynecologist every year. It will include a breast check, inspection of the vulva and vagina to check for abnormal growths or skin changes, a visual exam of the cervix, and then an exam to evaluate the size and shape of the uterus and  ovaries.  And after 57 years of age, a rectal exam is indicated to check for rectal cancers. We will also provide age appropriate advice regarding health maintenance, like when you should have certain vaccines, screening for sexually transmitted diseases, bone density testing, colonoscopy, mammograms, etc.   MAMMOGRAMS:  All women over 40 years old should have a yearly mammogram. Many facilities now offer a "3D" mammogram, which may cost around $50 extra out of pocket. If possible,  we recommend you accept the option to have the 3D mammogram performed.  It both reduces the number of women who will be called back for extra views which then turn out to be normal, and it is better than the routine mammogram at detecting truly abnormal areas.    COLONOSCOPY:  Colonoscopy to screen for colon cancer is recommended for all women at age 50.  We know, you hate the idea of the prep.  We agree, BUT, having colon cancer and not knowing it is worse!!  Colon cancer so often starts as a polyp that can be seen and removed at colonscopy, which can quite literally save your life!  And if your first colonoscopy is normal and you have no family history of colon cancer, most women don't have to have it again for 10 years.  Once every ten years, you can do something that may end up saving your life, right?  We will be happy to help you get it scheduled when you are ready.    Be sure to check your insurance coverage so you understand how much it will cost.  It may be covered as a preventative service at no cost, but you should check your particular policy.     Menopause and Herbal Products WHAT IS MENOPAUSE? Menopause is the normal time of life when menstrual periods decrease in frequency and eventually stop completely. This process can take several years for some women. Menopause is complete when you have had an absence of menstruation for a full year since your last menstrual period. It usually occurs between the ages of 48 and  55. It is not common for menopause to begin before the age of 40. During menopause, your body stops producing the female hormones estrogen and progesterone. Common symptoms associated with this loss of hormones (vasomotor symptoms) are:  Hot flashes.  Hot flushes.  Night sweats. Other common symptoms and complications of menopause include:  Decrease in sex drive.  Vaginal dryness and thinning of the walls of the vagina. This can make sex painful.  Dryness of the skin and development of wrinkles.  Headaches.  Tiredness.  Irritability.  Memory problems.  Weight gain.  Bladder infections.  Hair growth on the face and chest.  Inability to reproduce offspring (infertility).  Loss of density in the bones (osteoporosis) increasing your risk for breaks (fractures).  Depression.  Hardening and narrowing of the arteries (atherosclerosis). This increases your risk of heart attack and stroke. WHAT TREATMENT OPTIONS ARE AVAILABLE? There are many treatment choices for menopause symptoms. The most common treatment is hormone replacement therapy. Many alternative therapies for menopause are emerging, including the use of herbal products. These supplements can be found in the form of herbs, teas, oils, tinctures, and pills. Common herbal supplements for menopause are made from plants that contain phytoestrogens. Phytoestrogens are compounds that occur naturally in plants and plant products. They act like estrogen in the body. Foods and herbs that contain phytoestrogens include:  Soy.  Flax seeds.  Red clover.  Ginseng. WHAT MENOPAUSE SYMPTOMS MAY BE HELPED IF I USE HERBAL PRODUCTS?  Vasomotor symptoms. These may be helped by:  Soy. Some studies show that soy may have a moderate benefit for hot flashes.  Black cohosh. There is limited evidence indicating this may be beneficial for hot flashes.  Symptoms that are related to heart and blood vessel disease. These may be helped by  soy. Studies have shown that soy can help to lower cholesterol.  Depression. This may be helped by:  St. John's wort. There is limited evidence that shows this may help mild to moderate depression.  Black cohosh. There is evidence that this may help depression and mood swings.  Osteoporosis. Soy may help to decrease bone loss that is associated with menopause and may prevent osteoporosis. Limited evidence indicates that red clover may offer some bone loss protection as well. Other herbal products that are commonly used during menopause lack enough evidence to support their use as a replacement for conventional menopause therapies. These products include evening primrose, ginseng, and red clover. WHAT ARE THE CASES WHEN HERBAL PRODUCTS SHOULD NOT BE USED DURING MENOPAUSE? Do not use herbal products during menopause without your health care provider's approval if:  You are taking medicine.  You have a preexisting liver condition. ARE THERE ANY RISKS IN MY TAKING HERBAL PRODUCTS DURING MENOPAUSE? If you choose to use herbal products to help with symptoms of menopause, keep in mind that:  Different supplements have different and unmeasured amounts of herbal ingredients.    Herbal products are not regulated the same way that medicines are.  Concentrations of herbs may vary depending on the way they are prepared. For example, the concentration may be different in a pill, tea, oil, and tincture.  Little is known about the risks of using herbal products, particularly the risks of long-term use.  Some herbal supplements can be harmful when combined with certain medicines. Most commonly reported side effects of herbal products are mild. However, if used improperly, many herbal supplements can cause serious problems. Talk to your health care provider before starting any herbal product. If problems develop, stop taking the supplement and let your health care provider know.   This information is not  intended to replace advice given to you by your health care provider. Make sure you discuss any questions you have with your health care provider.   Document Released: 06/08/2007 Document Revised: 01/10/2014 Document Reviewed: 06/04/2013 Elsevier Interactive Patient Education 2016 [employer]  

## 2015-01-21 LAB — IPS PAP TEST WITH HPV

## 2015-07-06 ENCOUNTER — Emergency Department (HOSPITAL_BASED_OUTPATIENT_CLINIC_OR_DEPARTMENT_OTHER): Payer: No Typology Code available for payment source

## 2015-07-06 ENCOUNTER — Emergency Department (HOSPITAL_BASED_OUTPATIENT_CLINIC_OR_DEPARTMENT_OTHER)
Admission: EM | Admit: 2015-07-06 | Discharge: 2015-07-06 | Disposition: A | Discharge status: Home / Self Care | Payer: No Typology Code available for payment source | Attending: Emergency Medicine | Admitting: Emergency Medicine

## 2015-07-06 ENCOUNTER — Encounter (HOSPITAL_BASED_OUTPATIENT_CLINIC_OR_DEPARTMENT_OTHER): Payer: Self-pay | Admitting: *Deleted

## 2015-07-06 DIAGNOSIS — R0602 Shortness of breath: Secondary | ICD-10-CM | POA: Diagnosis not present

## 2015-07-06 DIAGNOSIS — Z87891 Personal history of nicotine dependence: Secondary | ICD-10-CM | POA: Diagnosis not present

## 2015-07-06 DIAGNOSIS — R079 Chest pain, unspecified: Secondary | ICD-10-CM

## 2015-07-06 DIAGNOSIS — R05 Cough: Secondary | ICD-10-CM | POA: Diagnosis not present

## 2015-07-06 DIAGNOSIS — R0789 Other chest pain: Secondary | ICD-10-CM | POA: Diagnosis present

## 2015-07-06 HISTORY — DX: Multiple sclerosis: G35

## 2015-07-06 LAB — BASIC METABOLIC PANEL
Anion gap: 8 (ref 5–15)
BUN: 15 mg/dL (ref 6–20)
CALCIUM: 9.3 mg/dL (ref 8.9–10.3)
CO2: 25 mmol/L (ref 22–32)
Chloride: 106 mmol/L (ref 101–111)
Creatinine, Ser: 0.69 mg/dL (ref 0.44–1.00)
GFR calc Af Amer: >60 mL/min (ref >60)
GLUCOSE: 91 mg/dL (ref 65–99)
Potassium: 3.6 mmol/L (ref 3.5–5.1)
SODIUM: 139 mmol/L (ref 135–145)

## 2015-07-06 LAB — CBC
HCT: 42.4 % (ref 36.0–46.0)
Hemoglobin: 14.3 g/dL (ref 12.0–15.0)
MCH: 29.9 pg (ref 26.0–34.0)
MCHC: 33.7 g/dL (ref 30.0–36.0)
MCV: 88.5 fL (ref 78.0–100.0)
PLATELETS: 261 10*3/uL (ref 150–400)
RBC: 4.79 MIL/uL (ref 3.87–5.11)
RDW: 13.1 % (ref 11.5–15.5)
WBC: 8.1 10*3/uL (ref 4.0–10.5)

## 2015-07-06 LAB — TROPONIN I

## 2015-07-06 MED ORDER — GI COCKTAIL ~~LOC~~
30.0000 mL | Freq: Once | ORAL | Status: AC
  Administered 2015-07-06: 30 mL via ORAL
  Filled 2015-07-06: qty 30

## 2015-07-06 MED ORDER — OMEPRAZOLE 20 MG PO CPDR: DELAYED_RELEASE_CAPSULE | ORAL | Status: DC

## 2015-07-06 NOTE — ED Notes (Signed)
Chest pain x 3 days. Worse at night that would require her to get out of bed and sleep in the recliner. Pressure that goes into her back. Left foot is tingling since getting to the ED. Headache earlier.

## 2015-07-06 NOTE — ED Provider Notes (Signed)
CSN: [id number]     Arrival date & time 07/06/15  0352 History  By signing my name below, I, Gabrielle Patterson, attest that this documentation has been prepared under the direction and in the presence of Gabrielle Crigler, PA-C.   Electronically Signed: Rosario Patterson, ED Scribe. 07/06/2015. 9:39 PM.   Chief Complaint  Patient presents with  . Chest Pain   The history is provided by the patient and the spouse. No language interpreter was used.   HPI Comments: Gabrielle Patterson is a 55 y.o. female with a PMHx significant for MS who presents to the Emergency Department complaining of sudden onset, unchanged, intermittent chest pressure onset 3 days PTA. She notes that the pressure radiates into her back. Her pressure is exacerbated at night when she is laying flat. Pt reports that her pressure is alleviated when she is sitting up. She states that she took a nap today, and when she woke up approximately 4 hours PTA the pressure was present and has not subsided since coming into the ED. She does not have SOB upon exertion, but notes that she does when she yawns. She has also had an intermittent dry cough that began two days ago. She reports that she took 2 Asprin tablets before she came into the ED with no relief of her chest pressure. She has a hx of heartburn, and takes antacid tablets which completely completely resolves her pain. She does not have a hx or FHx of chest pain or cardiac issues. No hx of blood clots. Pt has a SHx of smoking for 20 years, but has since quit. No recent long travel. She denies abdominal pain or bilateral leg swelling.   Past Medical History  Diagnosis Date  . Restless leg syndrome   . Diverticulosis   . Multiple sclerosis [facility])    Past Surgical History  Procedure Laterality Date  . Breast surgery  11/08    Rt.breast Bx--Florid fibrocystic changes--high risk but negative for neoplasm  . 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 Endometrial biopsy  12-15-04    --benign  endocervical polyp--Dr. Tresa Res  . Tubal ligation    . Endometrial ablation  03-07-05    -HTA   Family History  Problem Relation Age of Onset  . Diabetes Mother   . Hypertension Mother   . Hyperlipidemia Mother   . Cancer Mother 48    pancreatic ca  . Breast cancer Maternal Grandmother   . Ovarian cancer Paternal Grandmother   . Diabetes Paternal Grandmother   . Diabetes Father   . Hypertension Father   . Stroke Father   . Thyroid disease Sister    Social History  Substance Use Topics  . Smoking status: Former Games developer  . Smokeless tobacco: None  . Alcohol Use: Yes     Comment: occ   OB History    Gravida Para Term Preterm AB TAB SAB Ectopic Multiple Living   3 2 2  1  1   2      Review of Systems  Constitutional: Negative for fever and diaphoresis.  Eyes: Negative for redness.  Respiratory: Positive for cough and shortness of breath.   Cardiovascular: Positive for chest pain. Negative for palpitations and leg swelling.  Gastrointestinal: Negative for nausea, vomiting and abdominal pain.  Genitourinary: Negative for dysuria.  Musculoskeletal: Negative for back pain and neck pain.  Skin: Negative for rash.  Neurological: Negative for syncope and light-headedness.  Psychiatric/Behavioral: The patient is not nervous/anxious.  Allergies  Penicillins  Home Medications   Prior to Admission medications   Not on File   BP 140/83 mmHg  Pulse 67  Temp(Src) 97.8 F (36.6 C) (Oral)  Resp 18  Ht  (1.575 m)  Wt 169 lb (76.658 kg)  BMI 30.90 kg/m2  SpO2 100%  LMP 01/03/2009   Physical Exam  Constitutional: She appears well-developed and well-nourished.  HENT:  Head: Normocephalic and atraumatic.  Mouth/Throat: Mucous membranes are normal. Mucous membranes are not dry.  Eyes: Conjunctivae are normal.  Neck: Trachea normal and normal range of motion. Neck supple. Normal carotid pulses and no JVD present. No muscular tenderness present. Carotid bruit is not  present. No tracheal deviation present.  Cardiovascular: Normal rate, regular rhythm, S1 normal, S2 normal, normal heart sounds and intact distal pulses.  Exam reveals no decreased pulses.   No murmur heard. Pulmonary/Chest: Effort normal. No respiratory distress. She has no wheezes. She exhibits no tenderness.  Abdominal: Soft. Normal aorta and bowel sounds are normal. There is no tenderness. There is no rebound and no guarding.  Musculoskeletal: Normal range of motion.  Neurological: She is alert.  Skin: Skin is warm and dry. She is not diaphoretic. No cyanosis. No pallor.  Psychiatric: She has a normal mood and affect.  Nursing note and vitals reviewed.   ED Course  Procedures (including critical care time)  DIAGNOSTIC STUDIES: Oxygen Saturation is 100% on RA, normal by my interpretation.   COORDINATION OF CARE: 9:39 PM-Discussed next steps with pt including EKG, CXR, CBC, BMP and Troponin. Pt verbalized understanding and is agreeable with the plan.   Labs Review Labs Reviewed  CBC  BASIC METABOLIC PANEL  TROPONIN I    Imaging Review Dg Chest 2 View  07/06/2015  CLINICAL DATA:  Acute onset of central chest pain, radiating to the back. Initial encounter. EXAM: CHEST  2 VIEW COMPARISON:  None. FINDINGS: The lungs are well-aerated and clear. There is no evidence of focal opacification, pleural effusion or pneumothorax. The heart is normal in size; the mediastinal contour is within normal limits. No acute osseous abnormalities are seen. IMPRESSION: No acute cardiopulmonary process seen. Electronically Signed   By: Roanna Raider M.D.   On: 07/06/2015 21:55    I have personally reviewed and evaluated these images and lab results as part of my medical decision-making.   EKG Interpretation   Date/Time:  Monday July 06 2015 19:37:06 EDT Ventricular Rate:  74 PR Interval:  138 QRS Duration: 96 QT Interval:  412 QTC Calculation: 457 R Axis:   32 Text Interpretation:  Normal  sinus rhythm Normal ECG No previous tracing  Confirmed by Anitra Lauth  MD, Alphonzo Lemmings ([id number]) on 07/06/2015 8:40:10 PM       11:20 PM Patient updated on results. HEART=1. Discussed with Dr. Anitra Lauth.   Plan: GI cocktail prior to d/c, start on Prilosec. Discussed that she should be followed up by her primary care physician or by cardiology referral (given).   Patient was counseled to return with severe chest pain, especially if the pain is crushing or pressure-like and spreads to the arms, back, neck, or jaw, or if they have sweating, nausea, or shortness of breath with the pain. They were encouraged to call 911 with these symptoms.   They were also told to return if their chest pain gets worse and does not go away with rest, they have an attack of chest pain lasting longer than usual despite rest and treatment with the medications their  caregiver has prescribed, if they wake from sleep with chest pain or shortness of breath, if they feel dizzy or faint, if they have chest pain not typical of their usual pain, or if they have any other emergent concerns regarding their health.  The patient verbalized understanding and agreed.    MDM   Final diagnoses:  Chest pain, unspecified chest pain type   Patient with chest pressure, atypical story. No CP with exertion. No vomiting, radiation or diaphoresis. She does have some associated heartburn which is resolved with Tums. Troponin checked greater than 4 hours after start of current chest pressure is negative. EKG is normal. Patient has no other risk factors other than age. No concern for PE given risk factor profile. Also she is not tachycardic or hypoxic. Chest x-ray is clear. Will start on treatment for GERD and have patient follow up closely with her PCP/cardiologist for further testing and reassurance. Instructed that evaluation in ED does not completely preclude cardiac chest pain and that she should return with any worsening or changing symptoms. We  discussed that we feel she is low risk (HEART=1) for cardiac or concerning pulmonary etiology at this time. Patient verbalizes understanding and agrees with plans. She seems reliable to return with worsening.  I personally performed the services described in this documentation, which was scribed in my presence. The recorded information has been reviewed and is accurate.    Gabrielle Crigler, PA-C 07/06/15 1610  Gwyneth Sprout, MD 07/06/15 2350

## 2015-07-06 NOTE — ED Notes (Signed)
Pt's husband states "its a good thing you called her back now cause we were just about to go home".

## 2015-07-06 NOTE — Discharge Instructions (Signed)
Please read and follow all provided instructions.  Your diagnoses today include:  1. Chest pain, unspecified chest pain type     Tests performed today include:  An EKG of your heart  A chest x-ray  Cardiac enzymes - a blood test for heart muscle damage  Blood counts and electrolytes  Vital signs. See below for your results today.   Medications prescribed:   Omeprazole (Prilosec) - stomach acid reducer  This medication can be found over-the-counter  Take any prescribed medications only as directed.  Follow-up instructions: Please follow-up with your primary care provider as soon as you can for further evaluation of your symptoms. You may also follow-up with the cardiologist listed.   Return instructions:  SEEK IMMEDIATE MEDICAL ATTENTION IF:  You have severe chest pain, especially if the pain is crushing or pressure-like and spreads to the arms, back, neck, or jaw, or if you have sweating, nausea (feeling sick to your stomach), or shortness of breath. THIS IS AN EMERGENCY. Don't wait to see if the pain will go away. Get medical help at once. Call 911 or 0 (operator). DO NOT drive yourself to the hospital.   Your chest pain gets worse and does not go away with rest.   You have an attack of chest pain lasting longer than usual, despite rest and treatment with the medications your caregiver has prescribed.   You wake from sleep with chest pain or shortness of breath.  You feel dizzy or faint.  You have chest pain not typical of your usual pain for which you originally saw your caregiver.   You have any other emergent concerns regarding your health.  Additional Information: Chest pain comes from many different causes. Your caregiver has diagnosed you as having chest pain that is not specific for one problem, but does not require admission.  You are at low risk for an acute heart condition or other serious illness.   Your vital signs today were: BP 140/83 mmHg   Pulse 67    Temp(Src) 97.8 F (36.6 C) (Oral)   Resp 18   Ht 5\' 2"  (1.575 m)   Wt 76.658 kg   BMI 30.90 kg/m2   SpO2 100%   LMP 01/03/2009 If your blood pressure (BP) was elevated above 135/85 this visit, please have this repeated by your doctor within one month. --------------

## 2016-02-10 ENCOUNTER — Ambulatory Visit: Payer: PRIVATE HEALTH INSURANCE | Admitting: Obstetrics and Gynecology

## 2016-03-18 NOTE — Progress Notes (Signed)
56 y.o. G35P2012 Married Caucasian female here for annual exam.    Always leaked urine with cough or sneeze.  Not of concern to her.  Wears a pad.   Working with a Psychologist, educational for weight loss.   Has a skin area that is raised that she is asking for a check.  She will see someone in [place].   Right knee pain when uses the treadmill.  Son just treated for ruptured appendix at [facility]  PCP:   [facility]  Patient's last menstrual period was 01/03/2009.           Sexually active: Yes.    The current method of family planning is tubal ligation/postmenopausal.    Exercising: Yes.    cardio/weights Smoker:  Former--quit 2007  Health Maintenance: Pap:01-19-15 Neg:Neg HR HPV;   History of abnormal Pap:  Yes; Hx cryotherapy to cervix in her 20's--paps normal since [facility]:  03/27/15 BIRADS 2 benign; patient scheduled 04/13/16 - [facility] Colonoscopy:  06-05-08 moderate diverticulosis;next due 06/2018 with Dr. Marina Goodell BMD:  n/a TDaP:  10/2012 HIV:  Donated blood 10 years ago.  Hep C:  Today.  Screening Labs:  Discuss today; blood drawn   reports that she has quit smoking. She has never used smokeless tobacco. She reports that she drinks alcohol. She reports that she does not use drugs.  Past Medical History:  Diagnosis Date  . Diverticulosis   . Multiple sclerosis (HCC)   . Restless leg syndrome     Past Surgical History:  Procedure Laterality Date  . BREAST SURGERY  11/08   Rt.breast Bx--Florid fibrocystic changes--high risk but negative for neoplasm  . ENDOMETRIAL ABLATION  03-07-05   -HTA  . ENDOMETRIAL BIOPSY  12-15-04   --benign endocervical polyp--Dr. Tresa Res  . throat tumor  11/06   --benign  . TUBAL LIGATION      No current outpatient prescriptions on file.   No current facility-administered medications for this visit.     Family History  Problem Relation Age of Onset  . Diabetes Mother   . Hypertension Mother   . Hyperlipidemia Mother   . Cancer Mother 12     pancreatic ca  . Diabetes Father   . Hypertension Father   . Stroke Father   . Thyroid disease Sister   . Breast cancer Maternal Grandmother   . Ovarian cancer Paternal Grandmother   . Diabetes Paternal Grandmother     ROS:  Pertinent items are noted in HPI.  Otherwise, a comprehensive ROS was negative.  Exam:   BP 118/78 (BP Location: Right Arm, Patient Position: Sitting, Cuff Size: Large)   Pulse 68   Resp 16   Ht 5' 1.25" (1.556 m)   Wt 165 lb (74.8 kg)   LMP 01/03/2009   BMI 30.92 kg/m     General appearance: alert, cooperative and appears stated age Head: Normocephalic, without obvious abnormality, atraumatic Neck: no adenopathy, supple, symmetrical, trachea midline and thyroid normal to inspection and palpation Lungs: clear to auscultation bilaterally Breasts: normal appearance, no masses or tenderness, No nipple retraction or dimpling, No nipple discharge or bleeding, No axillary or supraclavicular adenopathy Heart: regular rate and rhythm Abdomen: soft, non-tender; no masses, no organomegaly Extremities: extremities normal, atraumatic, no cyanosis or edema Skin: Skin color, texture, turgor normal. No rashes or lesions Lymph nodes: Cervical, supraclavicular, and axillary nodes normal. No abnormal inguinal nodes palpated Neurologic: Grossly normal  Pelvic: External genitalia:  no lesions  Urethra:  normal appearing urethra with no masses, tenderness or lesions              Bartholins and Skenes: normal                 Vagina: normal appearing vagina with normal color and discharge, no lesions              Cervix: no lesions              Pap taken: No. Bimanual Exam:  Uterus:  normal size, contour, position, consistency, mobility, non-tender.  Good Kegel.               Adnexa: no mass, fullness, tenderness              Rectal exam: Yes.  .  Confirms.              Anus:  normal sphincter tone, no lesions  Chaperone was present for exam.  Assessment:    Well woman visit with normal exam. Remote hx of abnormal pap.  Hx of endometrial ablation.  Menopausal symptoms.  GSI.  FH of breast cancer and ovarian cancer - each on different side of the family.   Plan: Mammogram screening discussed. Recommended self breast awareness. Pap and HR HPV as above. Guidelines for Calcium, Vitamin D, regular exercise program including cardiovascular and weight bearing exercise. Kegel's.   Weight loss encouraged.  I discussed PT and surgery if needed for GSI. Routine labs and hep C. Follow up annually and prn.       After visit summary provided.

## 2016-03-21 ENCOUNTER — Encounter: Payer: Self-pay | Admitting: Obstetrics and Gynecology

## 2016-03-21 ENCOUNTER — Ambulatory Visit (INDEPENDENT_AMBULATORY_CARE_PROVIDER_SITE_OTHER): Payer: PRIVATE HEALTH INSURANCE | Admitting: Obstetrics and Gynecology

## 2016-03-21 VITALS — BP 118/78 | HR 68 | Resp 16 | Ht 61.25 in | Wt 165.0 lb

## 2016-03-21 DIAGNOSIS — Z119 Encounter for screening for infectious and parasitic diseases, unspecified: Secondary | ICD-10-CM

## 2016-03-21 DIAGNOSIS — Z01419 Encounter for gynecological examination (general) (routine) without abnormal findings: Secondary | ICD-10-CM

## 2016-03-21 DIAGNOSIS — E559 Vitamin D deficiency, unspecified: Secondary | ICD-10-CM

## 2016-03-21 LAB — LIPID PANEL
CHOL/HDL RATIO: 3.5 ratio (ref <5.0)
Cholesterol: 205 mg/dL — ABNORMAL HIGH (ref <200)
HDL: 58 mg/dL (ref >50)
LDL CALC: 128 mg/dL — AB (ref <100)
Triglycerides: 97 mg/dL (ref <150)
VLDL: 19 mg/dL (ref <30)

## 2016-03-21 LAB — CBC
HCT: 45 % (ref 35.0–45.0)
Hemoglobin: 14.7 g/dL (ref 11.7–15.5)
MCH: 29.3 pg (ref 27.0–33.0)
MCHC: 32.7 g/dL (ref 32.0–36.0)
MCV: 89.8 fL (ref 80.0–100.0)
MPV: 11.2 fL (ref 7.5–12.5)
PLATELETS: 294 10*3/uL (ref 140–400)
RBC: 5.01 MIL/uL (ref 3.80–5.10)
RDW: 12.9 % (ref 11.0–15.0)
WBC: 5.9 10*3/uL (ref 3.8–10.8)

## 2016-03-21 LAB — COMPREHENSIVE METABOLIC PANEL
ALK PHOS: 83 U/L (ref 33–130)
ALT: 18 U/L (ref 6–29)
AST: 16 U/L (ref 10–35)
Albumin: 4.3 g/dL (ref 3.6–5.1)
BILIRUBIN TOTAL: 0.4 mg/dL (ref 0.2–1.2)
BUN: 15 mg/dL (ref 7–25)
CHLORIDE: 104 mmol/L (ref 98–110)
CO2: 27 mmol/L (ref 20–31)
Calcium: 9.7 mg/dL (ref 8.6–10.4)
Creat: 0.67 mg/dL (ref 0.50–1.05)
Glucose, Bld: 74 mg/dL (ref 65–99)
POTASSIUM: 4.4 mmol/L (ref 3.5–5.3)
SODIUM: 141 mmol/L (ref 135–146)
Total Protein: 6.8 g/dL (ref 6.1–8.1)

## 2016-03-21 LAB — HEPATITIS C ANTIBODY: HCV AB: NEGATIVE

## 2016-03-21 LAB — TSH: TSH: 1.31 m[IU]/L

## 2016-03-21 NOTE — Patient Instructions (Signed)

## 2016-03-22 LAB — VITAMIN D 25 HYDROXY (VIT D DEFICIENCY, FRACTURES): Vit D, 25-Hydroxy: 25 ng/mL — ABNORMAL LOW (ref 30–100)

## 2016-03-23 ENCOUNTER — Encounter: Payer: Self-pay | Admitting: Obstetrics and Gynecology

## 2016-03-23 MED ORDER — VITAMIN D (ERGOCALCIFEROL) 1.25 MG (50000 UNIT) PO CAPS: 50000.0000 [IU] | ORAL_CAPSULE | ORAL | 0 refills | Status: DC

## 2016-03-23 NOTE — Addendum Note (Signed)
Addended by: Ardell Isaacs, Debbe Bales E on: 03/23/2016 09:16 PM   Modules accepted: Orders

## 2016-05-05 ENCOUNTER — Encounter: Payer: Self-pay | Admitting: Obstetrics and Gynecology

## 2016-09-22 ENCOUNTER — Encounter: Payer: Self-pay | Admitting: Family Medicine

## 2017-01-03 DIAGNOSIS — Z961 Presence of intraocular lens: Secondary | ICD-10-CM

## 2017-01-03 DIAGNOSIS — Z9849 Cataract extraction status, unspecified eye: Secondary | ICD-10-CM

## 2017-01-03 HISTORY — DX: Cataract extraction status, unspecified eye: Z98.49

## 2017-01-03 HISTORY — DX: Presence of intraocular lens: Z96.1

## 2017-01-11 ENCOUNTER — Emergency Department (HOSPITAL_BASED_OUTPATIENT_CLINIC_OR_DEPARTMENT_OTHER)
Admission: EM | Admit: 2017-01-11 | Discharge: 2017-01-11 | Disposition: A | Discharge status: Home / Self Care | Payer: No Typology Code available for payment source | Attending: Emergency Medicine | Admitting: Emergency Medicine

## 2017-01-11 ENCOUNTER — Emergency Department (HOSPITAL_BASED_OUTPATIENT_CLINIC_OR_DEPARTMENT_OTHER): Payer: No Typology Code available for payment source

## 2017-01-11 ENCOUNTER — Encounter (HOSPITAL_BASED_OUTPATIENT_CLINIC_OR_DEPARTMENT_OTHER): Payer: Self-pay

## 2017-01-11 ENCOUNTER — Other Ambulatory Visit: Payer: Self-pay

## 2017-01-11 DIAGNOSIS — R1031 Right lower quadrant pain: Secondary | ICD-10-CM | POA: Diagnosis not present

## 2017-01-11 DIAGNOSIS — R109 Unspecified abdominal pain: Secondary | ICD-10-CM

## 2017-01-11 DIAGNOSIS — R112 Nausea with vomiting, unspecified: Secondary | ICD-10-CM | POA: Insufficient documentation

## 2017-01-11 LAB — COMPREHENSIVE METABOLIC PANEL
ALT: 23 U/L (ref 14–54)
AST: 22 U/L (ref 15–41)
Albumin: 4.2 g/dL (ref 3.5–5.0)
Alkaline Phosphatase: 112 U/L (ref 38–126)
Anion gap: 8 (ref 5–15)
BUN: 11 mg/dL (ref 6–20)
CHLORIDE: 102 mmol/L (ref 101–111)
CO2: 27 mmol/L (ref 22–32)
CREATININE: 0.72 mg/dL (ref 0.44–1.00)
Calcium: 9.5 mg/dL (ref 8.9–10.3)
GFR calc Af Amer: >60 mL/min (ref >60)
Glucose, Bld: 89 mg/dL (ref 65–99)
POTASSIUM: 4.1 mmol/L (ref 3.5–5.1)
SODIUM: 137 mmol/L (ref 135–145)
Total Bilirubin: 0.7 mg/dL (ref 0.3–1.2)
Total Protein: 7.3 g/dL (ref 6.5–8.1)

## 2017-01-11 LAB — CBC WITH DIFFERENTIAL/PLATELET
Basophils Absolute: 0.1 10*3/uL (ref 0.0–0.1)
Basophils Relative: 1 %
EOS PCT: 1 %
Eosinophils Absolute: 0.1 10*3/uL (ref 0.0–0.7)
HCT: 43.6 % (ref 36.0–46.0)
HEMOGLOBIN: 14.8 g/dL (ref 12.0–15.0)
LYMPHS ABS: 2.8 10*3/uL (ref 0.7–4.0)
LYMPHS PCT: 39 %
MCH: 29.6 pg (ref 26.0–34.0)
MCHC: 33.9 g/dL (ref 30.0–36.0)
MCV: 87.2 fL (ref 78.0–100.0)
MONOS PCT: 8 %
Monocytes Absolute: 0.6 10*3/uL (ref 0.1–1.0)
NEUTROS PCT: 51 %
Neutro Abs: 3.8 10*3/uL (ref 1.7–7.7)
Platelets: 274 10*3/uL (ref 150–400)
RBC: 5 MIL/uL (ref 3.87–5.11)
RDW: 13 % (ref 11.5–15.5)
WBC: 7.3 10*3/uL (ref 4.0–10.5)

## 2017-01-11 LAB — URINALYSIS, ROUTINE W REFLEX MICROSCOPIC
Bilirubin Urine: NEGATIVE
Glucose, UA: NEGATIVE mg/dL
Ketones, ur: NEGATIVE mg/dL
LEUKOCYTES UA: NEGATIVE
Nitrite: NEGATIVE
PH: 6.5 (ref 5.0–8.0)
PROTEIN: NEGATIVE mg/dL

## 2017-01-11 LAB — URINALYSIS, MICROSCOPIC (REFLEX)

## 2017-01-11 MED ORDER — ONDANSETRON HCL 4 MG/2ML IJ SOLN
4.0000 mg | Freq: Once | INTRAMUSCULAR | Status: AC
  Administered 2017-01-11: 4 mg via INTRAVENOUS
  Filled 2017-01-11: qty 2

## 2017-01-11 MED ORDER — KETOROLAC TROMETHAMINE 15 MG/ML IJ SOLN
15.0000 mg | Freq: Once | INTRAMUSCULAR | Status: AC
  Administered 2017-01-11: 15 mg via INTRAVENOUS
  Filled 2017-01-11: qty 1

## 2017-01-11 MED ORDER — SODIUM CHLORIDE 0.9 % IV BOLUS (SEPSIS)
1000.0000 mL | Freq: Once | INTRAVENOUS | Status: AC
  Administered 2017-01-11: 1000 mL via INTRAVENOUS

## 2017-01-11 MED ORDER — ONDANSETRON 4 MG PO TBDP: 4.0000 mg | ORAL_TABLET | Freq: Three times a day (TID) | ORAL | 0 refills | Status: DC | PRN

## 2017-01-11 NOTE — ED Notes (Signed)
Pt verbalizes understanding of d/c instructions and denies any further needs at this time. 

## 2017-01-11 NOTE — ED Notes (Signed)
Patient transported to CT 

## 2017-01-11 NOTE — ED Provider Notes (Signed)
[facility] EMERGENCY DEPARTMENT Provider Note   CSN: [id number] Arrival date & time: 01/11/17  1322     History   Chief Complaint Chief Complaint  Patient presents with  . Flank Pain    HPI Gabrielle Patterson is a 57 y.o. female.  HPI  57 year old female presents with right flank pain for about 3 days.  She states it is constant.  It is moderate, 4/10 at rest but whenever she tries to move, even just sitting up in bed, the pain becomes much more severe and is around a 7 or 8 out of 10.  It is a sharp pain.  It does not radiate.  She denies any urinary symptoms but was told she had some blood in her urine when she went to her primary care physician this morning.  She has not had any fevers.  She vomited once last night and has had some nausea.  She denies any diarrhea or constipation.  She thought she might have gas so she tried some Dulcolax and Gas-X without relief.  She has never had pain like this before.  She denies any trauma or anything that could have strained her side or back.  Past Medical History:  Diagnosis Date  . Diverticulosis   . Multiple sclerosis (HCC)   . Restless leg syndrome   . Vitamin D deficiency     There are no active problems to display for this patient.   Past Surgical History:  Procedure Laterality Date  . BREAST SURGERY  11/08   Rt.breast Bx--Florid fibrocystic changes--high risk but negative for neoplasm  . ENDOMETRIAL ABLATION  03-07-05   -HTA  . ENDOMETRIAL BIOPSY  12-15-04   --benign endocervical polyp--Dr. Tresa Res  . throat tumor  11/06   --benign  . TUBAL LIGATION      OB History    Gravida Para Term Preterm AB Living   3 2 2   1 2    SAB TAB Ectopic Multiple Live Births   1               Home Medications    Prior to Admission medications   Medication Sig Start Date End Date Taking? Authorizing Provider  ondansetron (ZOFRAN ODT) 4 MG disintegrating tablet Take 1 tablet (4 mg total) by mouth every 8 (eight) hours as  needed for nausea or vomiting. 01/11/17   Pricilla Loveless, MD    Family History Family History  Problem Relation Age of Onset  . Diabetes Mother   . Hypertension Mother   . Hyperlipidemia Mother   . Cancer Mother 92       pancreatic ca  . Diabetes Father   . Hypertension Father   . Stroke Father   . Thyroid disease Sister   . Breast cancer Maternal Grandmother   . Ovarian cancer Paternal Grandmother   . Diabetes Paternal Grandmother     Social History Social History   Tobacco Use  . Smoking status: Former Games developer  . Smokeless tobacco: Never Used  Substance Use Topics  . Alcohol use: Yes    Comment: occ  . Drug use: No     Allergies   Penicillins   Review of Systems Review of Systems  Constitutional: Negative for fever.  Gastrointestinal: Positive for nausea and vomiting. Negative for abdominal pain, constipation and diarrhea.  Genitourinary: Positive for flank pain. Negative for dysuria and hematuria.  Musculoskeletal: Negative for arthralgias and back pain.  All other systems reviewed and are negative.  Physical Exam Updated Vital Signs BP 117/65 (BP Location: Right Arm)   Pulse 69   Temp (!) 97.5 F (36.4 C) (Oral)   Resp 18   LMP 01/03/2009   SpO2 98%   Physical Exam  Constitutional: She is oriented to person, place, and time. She appears well-developed and well-nourished.  HENT:  Head: Normocephalic and atraumatic.  Right Ear: External ear normal.  Left Ear: External ear normal.  Nose: Nose normal.  Eyes: Right eye exhibits no discharge. Left eye exhibits no discharge.  Cardiovascular: Normal rate, regular rhythm and normal heart sounds.  Pulmonary/Chest: Effort normal and breath sounds normal.  Abdominal: Soft. There is tenderness in the right lower quadrant. There is CVA tenderness (right).  Musculoskeletal:       Right hip: She exhibits normal range of motion and no tenderness.  Neurological: She is alert and oriented to person, place, and  time.  Skin: Skin is warm and dry.  Nursing note and vitals reviewed.    ED Treatments / Results  Labs (all labs ordered are listed, but only abnormal results are displayed) Labs Reviewed  URINALYSIS, ROUTINE W REFLEX MICROSCOPIC - Abnormal; Notable for the following components:      Result Value   Specific Gravity, Urine <1.005 (*)    Hgb urine dipstick TRACE (*)    All other components within normal limits  URINALYSIS, MICROSCOPIC (REFLEX) - Abnormal; Notable for the following components:   Bacteria, UA MANY (*)    Squamous Epithelial / LPF 0-5 (*)    All other components within normal limits  COMPREHENSIVE METABOLIC PANEL  CBC WITH DIFFERENTIAL/PLATELET    EKG  EKG Interpretation None       Radiology Ct Renal Stone Study  Result Date: 01/11/2017 CLINICAL DATA:  Right flank pain. EXAM: CT ABDOMEN AND PELVIS WITHOUT CONTRAST TECHNIQUE: Multidetector CT imaging of the abdomen and pelvis was performed following the standard protocol without IV contrast. COMPARISON:  None. FINDINGS: Lower chest: No acute abnormality. Hepatobiliary: No focal liver abnormality is seen. No gallstones, gallbladder wall thickening, or biliary dilatation. Pancreas: Unremarkable. No pancreatic ductal dilatation or surrounding inflammatory changes. Spleen: Normal in size without focal abnormality. Adrenals/Urinary Tract: Adrenal glands are unremarkable. Kidneys are without renal calculi, focal lesion, or hydronephrosis. 4.3 cm benign-appearing exophytic left renal cyst. Bladder is unremarkable. Stomach/Bowel: Stomach is within normal limits. Appendix appears normal. No evidence of bowel wall thickening, distention, or inflammatory changes. Diffuse colonic diverticulosis. Vascular/Lymphatic: Aortic atherosclerosis. No enlarged abdominal or pelvic lymph nodes. Reproductive: Uterus and bilateral adnexa are unremarkable. Other: No abdominal wall hernia or abnormality. No abdominopelvic ascites. Musculoskeletal: No  acute or significant osseous findings. IMPRESSION: No evidence of obstructive uropathy. Large but benign-appearing left renal cyst. Diffuse colonic diverticulosis without evidence of diverticulitis. Otherwise unremarkable nonenhanced CT of the abdomen and pelvis. Electronically Signed   By: Ted Mcalpine M.D.   On: 01/11/2017 16:20    Procedures Procedures (including critical care time)  Medications Ordered in ED Medications  sodium chloride 0.9 % bolus 1,000 mL (0 mLs Intravenous Stopped 01/11/17 1641)  ketorolac (TORADOL) 15 MG/ML injection 15 mg (15 mg Intravenous Given 01/11/17 1602)  ondansetron (ZOFRAN) injection 4 mg (4 mg Intravenous Given 01/11/17 1630)     Initial Impression / Assessment and Plan / ED Course  I have reviewed the triage vital signs and the nursing notes.  Pertinent labs & imaging results that were available during my care of the patient were reviewed by me and considered in my  medical decision making (see chart for details).     With small amount of blood in her urine and right flank pain, my concern was greatest for ureteral stone.  However CT is unremarkable.  She does not appear to have any acute emergent intra-abdominal or retroperitoneal emergency.  This could be musculoskeletal given that no clear cause is otherwise found.  I have recommended NSAIDs in addition to Tylenol.  She will be given antiemetics but I think the nausea is from the pain.  Otherwise follow-up with PCP.  She has bacteria in her urine but no signs or symptoms of dysuria so I think UTI is unlikely.  It is possible she passed a small stone.  Follow-up with PCP, return precautions.  Final Clinical Impressions(s) / ED Diagnoses   Final diagnoses:  Right flank pain    ED Discharge Orders        Ordered    ondansetron (ZOFRAN ODT) 4 MG disintegrating tablet  Every 8 hours PRN     01/11/17 1657       Pricilla Loveless, MD 01/12/17 [phone]

## 2017-01-11 NOTE — ED Triage Notes (Addendum)
Pt c/o right flank pain/hip area, nausea x 3-4 days-vomited x 1 last night-pain worse with movement-sent from PCP office

## 2017-03-29 ENCOUNTER — Ambulatory Visit: Payer: PRIVATE HEALTH INSURANCE | Admitting: Obstetrics and Gynecology

## 2017-04-12 ENCOUNTER — Ambulatory Visit (INDEPENDENT_AMBULATORY_CARE_PROVIDER_SITE_OTHER): Payer: PRIVATE HEALTH INSURANCE | Admitting: Obstetrics and Gynecology

## 2017-04-12 ENCOUNTER — Encounter: Payer: Self-pay | Admitting: Obstetrics and Gynecology

## 2017-04-12 ENCOUNTER — Other Ambulatory Visit: Payer: Self-pay

## 2017-04-12 VITALS — BP 110/76 | HR 70 | Resp 16 | Ht 61.5 in | Wt 166.8 lb

## 2017-04-12 DIAGNOSIS — N3946 Mixed incontinence: Secondary | ICD-10-CM | POA: Diagnosis not present

## 2017-04-12 DIAGNOSIS — Z01419 Encounter for gynecological examination (general) (routine) without abnormal findings: Secondary | ICD-10-CM

## 2017-04-12 NOTE — Patient Instructions (Signed)

## 2017-04-12 NOTE — Progress Notes (Signed)
57 y.o. G51P2012 Married Caucasian female here for annual exam.    Bladder leakage.  Leaks with laugh, cough, sneeze, or exercise.  Can leak for no reason at all. 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  Both are equal problems for her.  Wearing pads for a long time, ever since she was a child.  May have seen a urologist as a child.  2 caffeine per day.  Kegel's no help.  No vaginal protrusion.  No difficulty with intercourse.  Had a CT scan in Jan 2019 for right flank pain and a visit to the ER.  Dx of right renal cyst and colon diverticulosis.   PCP: None -- [facility]   Patient's last menstrual period was 01/03/2009.           Sexually active: Yes.    The current method of family planning is tubal ligation and post menopausal status.    Exercising: Yes.    Yoga Smoker:  Former -- quit 2007  Health Maintenance: Pap:  01-19-15 Neg:Neg HR HPV History of abnormal Pap:  Yes; Hx cryotherapy to cervix in her 20's--paps normal since [facility]G: 04/2016 normal per pt.--[facility] Colonoscopy:  06-05-08 moderate diverticulosis;next due 06/2018 with Dr. Marina Goodell BMD:   n/a  Result  n/a TDaP:  2014 Gardasil:   n/a HIV: Years ago--Neg Hep C: 03-21-16 Neg Screening Labs:      reports that she has quit smoking. She has never used smokeless tobacco. She reports that she drinks alcohol. She reports that she does not use drugs.  Past Medical History:  Diagnosis Date  . Diverticulosis   . Multiple sclerosis (HCC)   . Restless leg syndrome   . Vitamin D deficiency     Past Surgical History:  Procedure Laterality Date  . BREAST SURGERY  11/08   Rt.breast Bx--Florid fibrocystic changes--high risk but negative for neoplasm  . ENDOMETRIAL ABLATION  03-07-05   -HTA  . ENDOMETRIAL BIOPSY  12-15-04   --benign endocervical polyp--Dr. Tresa Res  . throat tumor  11/06   --benign  . TUBAL LIGATION      Current Outpatient Medications  Medication Sig Dispense Refill  . ondansetron (ZOFRAN ODT) 4 MG disintegrating  tablet Take 1 tablet (4 mg total) by mouth every 8 (eight) hours as needed for nausea or vomiting. 10 tablet 0   No current facility-administered medications for this visit.     Family History  Problem Relation Age of Onset  . Diabetes Mother   . Hypertension Mother   . Hyperlipidemia Mother   . Cancer Mother 30       pancreatic ca  . Diabetes Father   . Hypertension Father   . Stroke Father   . Thyroid disease Sister   . Breast cancer Maternal Grandmother   . Ovarian cancer Paternal Grandmother   . Diabetes Paternal Grandmother     ROS:  Pertinent items are noted in HPI.  Otherwise, a comprehensive ROS was negative.  Exam:   BP 110/76 (BP Location: Right Arm, Patient Position: Sitting, Cuff Size: Normal)   Pulse 70   Resp 16   Ht 5' 1.5" (1.562 m)   Wt 166 lb 12.8 oz (75.7 kg)   LMP 01/03/2009   BMI 31.01 kg/m     General appearance: alert, cooperative and appears stated age Head: Normocephalic, without obvious abnormality, atraumatic Neck: no adenopathy, supple, symmetrical, trachea midline and thyroid normal to inspection and palpation Lungs: clear to auscultation bilaterally Breasts: left nipple inversion (old change), normal appearance, no  masses or tenderness, No nipple retraction or dimpling, No nipple discharge or bleeding, No axillary or supraclavicular adenopathy Heart: regular rate and rhythm Abdomen: soft, non-tender; no masses, no organomegaly Extremities: extremities normal, atraumatic, no cyanosis or edema Skin: Skin color, texture, turgor normal. No rashes or lesions Lymph nodes: Cervical, supraclavicular, and axillary nodes normal. No abnormal inguinal nodes palpated Neurologic: Grossly normal  Pelvic: External genitalia:  no lesions              Urethra:  normal appearing urethra with no masses, tenderness or lesions              Bartholins and Skenes: normal                 Vagina: normal appearing vagina with normal color and discharge, no lesions.   First degree rectocele.               Cervix: no lesions              Pap taken: No. Bimanual Exam:  Uterus:  normal size, contour, position, consistency, mobility, non-tender              Adnexa: no mass, fullness, tenderness              Rectal exam: Yes.  .  Confirms.              Anus:  normal sphincter tone, no lesions  Chaperone was present for exam.  Assessment:   Well woman visit with normal exam. Remote hx of abnormal pap.  Hx of endometrial ablation.  Mixed incontinence.  First degree rectocele. FH of breast cancer and ovarian cancer - each on different side of the family.   Plan: Mammogram screening discussed. Recommended self breast awareness. Pap and HR HPV as above. Guidelines for Calcium, Vitamin D, regular exercise program including cardiovascular and weight bearing exercise. Declines physical therapy.  Reduce caffeine use.  Urodynamic testing recommended.  ACOG HO on incontinence.  Check vit D level. Stool softener prn.  Follow up annually and prn.     After visit summary provided.

## 2017-04-13 ENCOUNTER — Telehealth: Payer: Self-pay | Admitting: Obstetrics and Gynecology

## 2017-04-13 LAB — VITAMIN D 25 HYDROXY (VIT D DEFICIENCY, FRACTURES): Vit D, 25-Hydroxy: 25.8 ng/mL — ABNORMAL LOW (ref 30.0–100.0)

## 2017-04-13 NOTE — Telephone Encounter (Signed)
Spoke with patient. Reviewed Urodynamics bladder testing with patient. Urine check before urodynamics scheduled for 04/17/2017 at 2 pm. Follow up with Dr.Silva to discuss results scheduled for 05/02/2017 at 10 am. Patient is agreeable to all dates. Patient verbalizes understanding of testing. Urodynamics bladder testing information sheet mailed to patient's verified home address on file.  Routing to provider for final review. Patient agreeable to disposition. Will close encounter.

## 2017-04-13 NOTE — Telephone Encounter (Signed)
Patient returned call. Spoke with patient regarding benefit for urodynamic testing.  Patient understood and agreeable. Patient ready to schedule. Patient scheduled 04/24/17 for urodynamics testing. Patient aware of appointment date, arrival time and cancellation policy. No further questions.   Routing to Nurse to contact patient with pre-appointment instructions  Routing to Billie Ruddy, RN  cc: Nolen Mu, RN

## 2017-04-13 NOTE — Telephone Encounter (Signed)
Call placed to patient to review benefits and to scheduled urodynamic testing. Left voicemail message requesting a return call.

## 2017-04-13 NOTE — Telephone Encounter (Signed)
Left message to call Prithvi Kooi at [phone]. 

## 2017-04-17 ENCOUNTER — Ambulatory Visit (INDEPENDENT_AMBULATORY_CARE_PROVIDER_SITE_OTHER): Payer: PRIVATE HEALTH INSURANCE

## 2017-04-17 VITALS — BP 122/76 | HR 70 | Ht 61.5 in | Wt 168.6 lb

## 2017-04-17 DIAGNOSIS — R32 Unspecified urinary incontinence: Secondary | ICD-10-CM

## 2017-04-17 LAB — POCT URINALYSIS DIPSTICK
Bilirubin, UA: NEGATIVE
Blood, UA: NEGATIVE
Glucose, UA: NEGATIVE
Ketones, UA: NEGATIVE
Leukocytes, UA: NEGATIVE
NITRITE UA: NEGATIVE
Protein, UA: NEGATIVE
UROBILINOGEN UA: 0.2 U/dL
pH, UA: 5 (ref 5.0–8.0)

## 2017-04-17 NOTE — Addendum Note (Signed)
Addended by: Alphonsa Overall on: 04/17/2017 02:26 PM   Modules accepted: Level of Service

## 2017-04-17 NOTE — Progress Notes (Signed)
Patient here today for urinalysis prior to urodynamics testing.  Urine Dip:Negative 

## 2017-04-18 ENCOUNTER — Telehealth: Payer: Self-pay

## 2017-04-18 NOTE — Telephone Encounter (Signed)
-----   Message from Patton Salles, MD sent at 04/17/2017  5:19 PM EDT ----- Ok to proceed with urodynamics.  Cc- Billie Ruddy

## 2017-04-18 NOTE — Telephone Encounter (Signed)
Spoke with patient. Results given. Patient verbalizes understanding. Will proceed with urodynamics on 4/22 as scheduled. Encounter closed.

## 2017-04-24 ENCOUNTER — Ambulatory Visit (INDEPENDENT_AMBULATORY_CARE_PROVIDER_SITE_OTHER): Payer: PRIVATE HEALTH INSURANCE | Admitting: Obstetrics and Gynecology

## 2017-04-24 DIAGNOSIS — N3946 Mixed incontinence: Secondary | ICD-10-CM | POA: Diagnosis not present

## 2017-04-25 NOTE — Progress Notes (Signed)
Urodynamic procedure reviewed and verbal consent obtained. Procedure performed without difficulty and urinary leaking noted with valsalva maneuver when 282 cc, and 319 ccc of normal saline was instilled into the bladder. See Lumax report for full study details. Patient tolerated procedure well and post procedure instructions reviewed. Follow-up appointment scheduled for 05/02/2017 at 10 am with Dr.Silva.

## 2017-04-25 NOTE — Patient Instructions (Signed)
Follow up with Dr.Silva as scheduled on 05/02/2017 at 10 am.

## 2017-05-01 NOTE — Progress Notes (Signed)
GYNECOLOGY  VISIT   HPI: 57 y.o.   Married  Caucasian  female   [phone] with Patient's last menstrual period was 01/03/2009. here to discuss urodynamics results.  Bladder leakage.   Daughter in law present for the entire visit today.   Leaks with laugh, cough, sneeze, or exercise.  Can leak for no reason at all. 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  Both are equal problems for her.  Wearing pads for a long time, ever since she was a child.  May have seen a urologist as a child.  2 caffeine per day.  Kegel's no help.  States she strains to void to avoid having to go back to the bathroom.   No vaginal protrusion.  No difficulty with intercourse.  Has known first degree rectocele.    GYNECOLOGIC HISTORY: Patient's last menstrual period was 01/03/2009. Contraception: Tubal/Postmenopausal Menopausal hormone therapy:  none Last mammogram: 04-13-16 3D/Density B/Neg/BiRads2:[facility] Last pap smear:  01-09-15 Neg:Neg HR HPV        OB History    Gravida  3   Para  2   Term  2   Preterm      AB  1   Living  2     SAB  1   TAB      Ectopic      Multiple      Live Births                 There are no active problems to display for this patient.   Past Medical History:  Diagnosis Date  . Diverticulosis   . Multiple sclerosis (HCC)   . Restless leg syndrome   . Vitamin D deficiency     Past Surgical History:  Procedure Laterality Date  . BREAST SURGERY  11/08   Rt.breast Bx--Florid fibrocystic changes--high risk but negative for neoplasm  . ENDOMETRIAL ABLATION  03-07-05   -HTA  . ENDOMETRIAL BIOPSY  12-15-04   --benign endocervical polyp--Dr. Tresa Res  . throat tumor  11/06   --benign  . TUBAL LIGATION      No current outpatient medications on file.   No current facility-administered medications for this visit.      ALLERGIES: Penicillins  Family History  Problem Relation Age of Onset  . Diabetes Mother   . Hypertension Mother   . Hyperlipidemia Mother    . Cancer Mother 83       pancreatic ca  . Diabetes Father   . Hypertension Father   . Stroke Father   . Thyroid disease Sister   . Breast cancer Maternal Grandmother   . Ovarian cancer Paternal Grandmother   . Diabetes Paternal Grandmother     Social History   Socioeconomic History  . Marital status: Married    Spouse name: Not on file  . Number of children: Not on file  . Years of education: Not on file  . Highest education level: Not on file  Occupational History  . Not on file  Social Needs  . Financial resource strain: Not on file  . Food insecurity:    Worry: Not on file    Inability: Not on file  . Transportation needs:    Medical: Not on file    Non-medical: Not on file  Tobacco Use  . Smoking status: Former Games developer  . Smokeless tobacco: Never Used  Substance and Sexual Activity  . Alcohol use: Yes    Comment: occ  . Drug use: No  . Sexual activity: Yes  Partners: Male  Lifestyle  . Physical activity:    Days per week: Not on file    Minutes per session: Not on file  . Stress: Not on file  Relationships  . Social connections:    Talks on phone: Not on file    Gets together: Not on file    Attends religious service: Not on file    Active member of club or organization: Not on file    Attends meetings of clubs or organizations: Not on file    Relationship status: Not on file  . Intimate partner violence:    Fear of current or ex partner: Not on file    Emotionally abused: Not on file    Physically abused: Not on file    Forced sexual activity: Not on file  Other Topics Concern  . Not on file  Social History Narrative  . Not on file    ROS:  Pertinent items are noted in HPI.  PHYSICAL EXAMINATION:    BP 112/74 (BP Location: Right Arm, Patient Position: Sitting, Cuff Size: Normal)   Pulse 66   Ht 5' 1.5" (1.562 m)   Wt 166 lb 12.8 oz (75.7 kg)   LMP 01/03/2009   BMI 31.01 kg/m     General appearance: alert, cooperative and appears stated  age   Urodynamic testing performed on 04/24/17; Uroflow:  Not continuous.  Void 119 cc.  CMG:  S1, 2, 3 4 - 125 cc, 240 cc, 300 cc, 341 cc.  VLPP: 99 cm H2O at 341 cc. Stable CMG. UPP: 19 cm H2O.  Pressure flow:  PDet max 194 cm H2O.  Strain pattern.   (confirmed patient does Valsalva with voiding.)Voided 354 cc.   ASSESSMENT  Mixed incontinence.  Small rectocele.  PLAN  Discussion regarding overactive bladder and stress incontinence.   Etiologies and treatment reviewed and ACOG materials given to the patient.  Discussed observational management and Kegels, physical therapy, Impressa use, pessary use, weigh loss, and surgical treatment with midurethral sling and cystoscopy. We discussed permanent mesh materials which are currently the most widely used for this procedure.  I discussed slings being 85 - 90% effective in treatment of stress incontinence.  I discussed risks of slings including but not limited to erosions and exposure, dyspareunia, cystotomy, urinary retention and slower voiding, increase in urgency symptoms, need for prolonged catheterization or self catheterization, urinary tract infections, bleeding, infection, damage to surrounding organs, reaction to anesthesia, DVT, PE, death, need for reoperation, and recurrence of incontinence.  Patient would like to study her options further and declines surgery at this time.   An After Visit Summary was printed and given to the patient.  __25____ minutes face to face time of which over 50% was spent in counseling.

## 2017-05-02 ENCOUNTER — Encounter: Payer: Self-pay | Admitting: Obstetrics and Gynecology

## 2017-05-02 ENCOUNTER — Other Ambulatory Visit: Payer: Self-pay

## 2017-05-02 ENCOUNTER — Ambulatory Visit (INDEPENDENT_AMBULATORY_CARE_PROVIDER_SITE_OTHER): Payer: PRIVATE HEALTH INSURANCE | Admitting: Obstetrics and Gynecology

## 2017-05-02 VITALS — BP 112/74 | HR 66 | Ht 61.5 in | Wt 166.8 lb

## 2017-05-02 DIAGNOSIS — N3946 Mixed incontinence: Secondary | ICD-10-CM

## 2017-05-03 ENCOUNTER — Other Ambulatory Visit: Payer: Self-pay | Admitting: Obstetrics and Gynecology

## 2017-05-03 ENCOUNTER — Telehealth: Payer: Self-pay | Admitting: Obstetrics and Gynecology

## 2017-05-03 DIAGNOSIS — N3946 Mixed incontinence: Secondary | ICD-10-CM

## 2017-05-03 NOTE — Telephone Encounter (Signed)
Return call to patient. States she has discussed with husband and after reviewing options she has decided to proceed with surgery in late July. Requests to proceed with PT until then.  Advised PT for limited time prior to surgery may not have anticipated benefit. Will review with Dr Edward Jolly for instructions. Reviewed available surgery dates. Advised will need to confirm surgical plan to better advise on options and recovery.  Patient is planning a golf vacation and is planning surgeyr dates accordingly.  Advised will review with Dr Edward Jolly and call back.

## 2017-05-03 NOTE — Telephone Encounter (Signed)
Please advise on surgical procedure posting.

## 2017-05-03 NOTE — Telephone Encounter (Signed)
Patient called and is ready to schedule surgery  

## 2017-05-03 NOTE — Telephone Encounter (Signed)
I did put in the order for pelvic floor therapy with Ruben Gottron. Ok to start prior to surgery as we do not have a final surgical date at this time.

## 2017-05-04 NOTE — Telephone Encounter (Signed)
Surgery would be a TVT Exact midurethral sling and cystoscopy.

## 2017-05-04 NOTE — Telephone Encounter (Signed)
Call to patient home and cell numbers. Unable to leave message on either number (voice mail confirms name). Voice mailbox is full.

## 2017-05-05 NOTE — Telephone Encounter (Signed)
Call to patient. Advised that PT referral is in place and will contact her back with appointment info. Surgery date of 08-08-17 is agreeable for patient. Will proceed with scheduling.  Encounter closed.

## 2017-06-26 ENCOUNTER — Encounter: Payer: Self-pay | Admitting: Obstetrics and Gynecology

## 2017-07-17 ENCOUNTER — Telehealth: Payer: Self-pay | Admitting: *Deleted

## 2017-07-17 NOTE — Telephone Encounter (Signed)
Call to patient to review surgery instructions. Left message to call back.  

## 2017-07-18 NOTE — Telephone Encounter (Signed)
Call from patient. Surgery instructions reviewed and printed copy will be provided at surgery consult.   Routing to provider for review.  Will close encounter.

## 2017-07-24 NOTE — Progress Notes (Signed)
GYNECOLOGY  VISIT   HPI: 57 y.o.   Married  Caucasian  female   [phone] with Patient's last menstrual period was 01/03/2009.   here for   surgical consult   Has urinary stress incontinence, urinary urgency, and first degree rectocele which is asymptomatic. She leaks urine with couth, laugh, sneeze and exercise.  Her urodynamic testing confirmed stress incontinence and she had evidence of straining to void.  No difficulty with constipation or loss of control of stool. Does not have a bulge or difficulty with sexual activity.  PT has worked well to control overactive bladder.  Now up to bathroom only 1 - 2 times.   Having cataract surgery on left eye.  Told it is ok to have her surgery for her bladder following her eye surgery.  No symptoms of MS.  She can have weakness in left leg if she is stress out or tired.  GYNECOLOGIC HISTORY: Patient's last menstrual period was 01/03/2009. Contraception:  BTL/Post menopausal  Menopausal hormone therapy:  none Last mammogram:  05/04/17 density B/BIRADS 2 benign  Last pap smear:   01-09-15 negative, HR HPV negative         OB History    Gravida  3   Para  2   Term  2   Preterm      AB  1   Living  2     SAB  1   TAB      Ectopic      Multiple      Live Births                 Patient Active Problem List   Diagnosis Date Noted  . Mixed incontinence 05/02/2017    Past Medical History:  Diagnosis Date  . Diverticulosis   . Multiple sclerosis (HCC)   . Restless leg syndrome   . Vitamin D deficiency     Past Surgical History:  Procedure Laterality Date  . BREAST SURGERY  11/08   Rt.breast Bx--Florid fibrocystic changes--high risk but negative for neoplasm  . ENDOMETRIAL ABLATION  03-07-05   -HTA  . ENDOMETRIAL BIOPSY  12-15-04   --benign endocervical polyp--Dr. Tresa Res  . throat tumor  11/06   --benign  . TUBAL LIGATION      Current Outpatient Medications  Medication Sig Dispense Refill  .  Fexofenadine-Pseudoephedrine (ALLEGRA-D PO) Take by mouth as needed.     No current facility-administered medications for this visit.      ALLERGIES: Penicillins  Family History  Problem Relation Age of Onset  . Diabetes Mother   . Hypertension Mother   . Hyperlipidemia Mother   . Cancer Mother 52       pancreatic ca  . Diabetes Father   . Hypertension Father   . Stroke Father   . Thyroid disease Sister   . Breast cancer Maternal Grandmother   . Ovarian cancer Paternal Grandmother   . Diabetes Paternal Grandmother     Social History   Socioeconomic History  . Marital status: Married    Spouse name: Not on file  . Number of children: Not on file  . Years of education: Not on file  . Highest education level: Not on file  Occupational History  . Not on file  Social Needs  . Financial resource strain: Not on file  . Food insecurity:    Worry: Not on file    Inability: Not on file  . Transportation needs:    Medical: Not on  file    Non-medical: Not on file  Tobacco Use  . Smoking status: Former Games developer  . Smokeless tobacco: Never Used  Substance and Sexual Activity  . Alcohol use: Yes    Comment: occ  . Drug use: No  . Sexual activity: Yes    Partners: Male  Lifestyle  . Physical activity:    Days per week: Not on file    Minutes per session: Not on file  . Stress: Not on file  Relationships  . Social connections:    Talks on phone: Not on file    Gets together: Not on file    Attends religious service: Not on file    Active member of club or organization: Not on file    Attends meetings of clubs or organizations: Not on file    Relationship status: Not on file  . Intimate partner violence:    Fear of current or ex partner: Not on file    Emotionally abused: Not on file    Physically abused: Not on file    Forced sexual activity: Not on file  Other Topics Concern  . Not on file  Social History Narrative  . Not on file    Review of Systems   Constitutional: Negative.   HENT: Negative.   Eyes: Negative.   Respiratory: Negative.   Cardiovascular: Negative.   Gastrointestinal: Negative.   Endocrine: Negative.   Genitourinary:       Loss of urine spontaneously Loss of urine with cough or sneeze Night urination    Musculoskeletal: Negative.   Skin: Negative.   Allergic/Immunologic: Negative.   Neurological: Negative.   Hematological: Negative.   Psychiatric/Behavioral: Negative.     PHYSICAL EXAMINATION:    BP 106/62 (BP Location: Right Arm, Patient Position: Sitting, Cuff Size: Normal)   Pulse 82   Resp 14   Ht 5\' 2"  (1.575 m)   Wt 167 lb 1.6 oz (75.8 kg)   LMP 01/03/2009   BMI 30.56 kg/m     General appearance: alert, cooperative and appears stated age Head: Normocephalic, without obvious abnormality, atraumatic Neck: no adenopathy, supple, symmetrical, trachea midline and thyroid normal to inspection and palpation Lungs: clear to auscultation bilaterally Heart: regular rate and rhythm Abdomen: soft, non-tender, no masses,  no organomegaly Extremities: extremities normal, atraumatic, no cyanosis or edema Skin: Skin color, texture, turgor normal. No rashes or lesions Lymph nodes: Cervical, supraclavicular, and axillary nodes normal. No abnormal inguinal nodes palpated Neurologic: Grossly normal  Pelvic: External genitalia:  no lesions              Urethra:  normal appearing urethra with no masses, tenderness or lesions              Bartholins and Skenes: normal                 Vagina: normal appearing vagina with normal color and discharge, no lesions.  Second degree rectocele.               Cervix: no lesions                Bimanual Exam:  Uterus:  normal size, contour, position, consistency, mobility, non-tender              Adnexa: no mass, fullness, tenderness              Rectal exam: Yes.  .  Confirms.  Anus:  normal sphincter tone, no lesions  Chaperone was present for  exam.  ASSESSMENT  GSI.  Second degree rectocele.   PLAN  We discussed TVT midurethral sling and rectocele repair.  She understands that the TVT uses permanent mesh materials. I discussed slings being 85 - 90% effective in treatment of stress incontinence.  I discussed risks of slings including but not limited to erosions and exposure, dyspareunia, cystotomy, urinary retention and slower voiding, increase in urgency symptoms, need for prolonged catheterization or self catheterization, urinary tract infections, and recurrence of incontinence.  Rectocele may have risks of proctotomy, need for colostomy, dyspareunia, and recurrence of prolapse. She understands that surgical risks in general may include bleeding, infection, damage to surrounding organs, reaction to anesthesia, DVT, PE, death, and need for reoperation  Surgical expectations and recovery discussed.  She wishes to proceed.   An After Visit Summary was printed and given to the patient.  __25____ minutes face to face time of which over 50% was spent in counseling.

## 2017-07-25 ENCOUNTER — Ambulatory Visit (INDEPENDENT_AMBULATORY_CARE_PROVIDER_SITE_OTHER): Payer: PRIVATE HEALTH INSURANCE | Admitting: Obstetrics and Gynecology

## 2017-07-25 ENCOUNTER — Other Ambulatory Visit: Payer: Self-pay

## 2017-07-25 ENCOUNTER — Encounter: Payer: Self-pay | Admitting: Obstetrics and Gynecology

## 2017-07-25 VITALS — BP 106/62 | HR 82 | Resp 14 | Ht 62.0 in | Wt 167.1 lb

## 2017-07-25 DIAGNOSIS — N816 Rectocele: Secondary | ICD-10-CM | POA: Diagnosis not present

## 2017-07-25 DIAGNOSIS — N393 Stress incontinence (female) (male): Secondary | ICD-10-CM

## 2017-07-25 NOTE — Patient Instructions (Signed)

## 2017-07-31 ENCOUNTER — Encounter: Payer: Self-pay | Admitting: Family Medicine

## 2017-07-31 ENCOUNTER — Ambulatory Visit (INDEPENDENT_AMBULATORY_CARE_PROVIDER_SITE_OTHER): Payer: PRIVATE HEALTH INSURANCE | Admitting: Family Medicine

## 2017-07-31 VITALS — BP 125/77 | HR 88 | Temp 97.9°F | Resp 12 | Ht 62.0 in | Wt 169.1 lb

## 2017-07-31 DIAGNOSIS — Z6830 Body mass index (BMI) 30.0-30.9, adult: Secondary | ICD-10-CM

## 2017-07-31 DIAGNOSIS — E66811 Obesity, class 1: Secondary | ICD-10-CM

## 2017-07-31 DIAGNOSIS — N3946 Mixed incontinence: Secondary | ICD-10-CM

## 2017-07-31 DIAGNOSIS — E669 Obesity, unspecified: Secondary | ICD-10-CM | POA: Insufficient documentation

## 2017-07-31 DIAGNOSIS — E785 Hyperlipidemia, unspecified: Secondary | ICD-10-CM | POA: Insufficient documentation

## 2017-07-31 DIAGNOSIS — E6609 Other obesity due to excess calories: Secondary | ICD-10-CM

## 2017-07-31 DIAGNOSIS — E7800 Pure hypercholesterolemia, unspecified: Secondary | ICD-10-CM

## 2017-07-31 DIAGNOSIS — N816 Rectocele: Secondary | ICD-10-CM

## 2017-07-31 NOTE — Patient Instructions (Addendum)
A few things to remember from today's visit:   Mixed incontinence  Rectocele  Hypercholesterolemia   Please be sure medication list is accurate. If a new problem present, please set up appointment sooner than planned today.

## 2017-07-31 NOTE — Progress Notes (Signed)
HPI:   Gabrielle Patterson is a 57 y.o. female, who is here today to re-establish care with me.  She was a former patient of mine, I saw her last at [facility] about 3 years ago.  Former PCP: Dr Abigail Miyamoto Last preventive routine visit: 04/2017 She follows with gynecologist regularly, last preventive visit in 04/2017.  Chronic medical problems: Vitamin D deficiency,allergic rhinitis,HLD, urine incontinence, and rectocele.    Hyperlipidemia: She is currently on nonpharmacologic treatment. She has not been consistent with a healthy diet. She exercises regularly.  Lab Results  Component Value Date   CHOL 205 (H) 03/21/2016   HDL 58 03/21/2016   LDLCALC 128 (H) 03/21/2016   TRIG 97 03/21/2016   CHOLHDL 3.5 03/21/2016    Concerns today:   She does not have any major concerns today. She is planning on having surgery in a few days rectocele repair and transvaginal tape procedure for mixed urine incontinence.  She wonders about limitation she will had total recovery time.   Review of Systems  Constitutional: Negative for activity change, appetite change, fatigue, fever and unexpected weight change.  HENT: Negative for mouth sores, nosebleeds and trouble swallowing.   Eyes: Negative for redness and visual disturbance.  Respiratory: Negative for cough, shortness of breath and wheezing.   Cardiovascular: Negative for chest pain, palpitations and leg swelling.  Gastrointestinal: Negative for abdominal pain, nausea and vomiting.       Negative for changes in bowel habits.  Endocrine: Negative for cold intolerance and heat intolerance.  Genitourinary: Negative for decreased urine volume, dysuria and hematuria.  Allergic/Immunologic: Positive for environmental allergies.  Neurological: Negative for syncope, weakness and headaches.      Current Outpatient Medications on File Prior to Visit  Medication Sig Dispense Refill  . Fexofenadine-Pseudoephedrine (ALLEGRA-D PO)  Take by mouth as needed.     No current facility-administered medications on file prior to visit.      Past Medical History:  Diagnosis Date  . Allergic rhinitis   . Allergy   . Diverticulosis   . History of cataract removal with insertion of prosthetic lens 2019   Bilateral  . Multiple sclerosis (HCC)   . Urine incontinence   . Vitamin D deficiency    Allergies  Allergen Reactions  . Penicillins Hives    Family History  Problem Relation Age of Onset  . Diabetes Mother   . Hypertension Mother   . Hyperlipidemia Mother   . Cancer Mother 10       pancreatic ca  . Diabetes Father   . Hypertension Father   . Stroke Father   . Hearing loss Father   . Heart attack Father   . Kidney disease Father   . Thyroid disease Sister   . Breast cancer Maternal Grandmother   . Cancer Maternal Grandmother   . Ovarian cancer Paternal Grandmother   . Diabetes Paternal Grandmother   . Cancer Paternal Grandmother     Social History   Socioeconomic History  . Marital status: Married    Spouse name: Not on file  . Number of children: 2  . Years of education: Not on file  . Highest education level: Not on file  Occupational History  . Not on file  Social Needs  . Financial resource strain: Not on file  . Food insecurity:    Worry: Not on file    Inability: Not on file  . Transportation needs:    Medical: Not on file  Non-medical: Not on file  Tobacco Use  . Smoking status: Former Games developer  . Smokeless tobacco: Never Used  Substance and Sexual Activity  . Alcohol use: Yes    Comment: occ  . Drug use: No  . Sexual activity: Yes    Partners: Male  Lifestyle  . Physical activity:    Days per week: Not on file    Minutes per session: Not on file  . Stress: Not on file  Relationships  . Social connections:    Talks on phone: Not on file    Gets together: Not on file    Attends religious service: Not on file    Active member of club or organization: Not on file     Attends meetings of clubs or organizations: Not on file    Relationship status: Not on file  Other Topics Concern  . Not on file  Social History Narrative  . Not on file    Vitals:   07/31/17 1357  BP: 125/77  Pulse: 88  Resp: 12  Temp: 97.9 F (36.6 C)  SpO2: 96%    Body mass index is 30.93 kg/m.   Physical Exam  Nursing note and vitals reviewed. Constitutional: She is oriented to person, place, and time. She appears well-developed. No distress.  HENT:  Head: Atraumatic.  Mouth/Throat: Oropharynx is clear and moist and mucous membranes are normal.  Eyes: Pupils are equal, round, and reactive to light. Conjunctivae and EOM are normal.  Cardiovascular: Normal rate and regular rhythm.  No murmur heard. Pulses:      Dorsalis pedis pulses are 2+ on the right side, and 2+ on the left side.  Respiratory: Effort normal and breath sounds normal. No respiratory distress.  GI: Soft. She exhibits no mass. There is no hepatomegaly. There is no tenderness.  Musculoskeletal: She exhibits no edema.  Lymphadenopathy:    She has no cervical adenopathy.  Neurological: She is alert and oriented to person, place, and time. She has normal strength. Coordination normal.  Skin: Skin is warm. No erythema.  Psychiatric: She has a normal mood and affect.  Well groomed, good eye contact.      ASSESSMENT AND PLAN:  Gabrielle Patterson was seen today for establish care.  Diagnoses and all orders for this visit:  Hypercholesterolemia  Mild. She will continue nonpharmacologic treatment. We will recheck lipid panel in a year.  Mixed incontinence  Planning on having surgical treatment next week. Some basic information about procedure and recovery time discussed. Recommend following with gyn/surgeon if she has concerns or further questions.  Rectocele  Rectocele next week. We discussed some side effects of opioids, constipation among some.  Class 1 obesity due to excess calories without  serious comorbidity with body mass index (BMI) of 30.0 to 30.9 in adult  We discussed benefits of wt loss as well as adverse effects of obesity. Consistency with healthy diet and physical activity recommended.     Gabrielle G. [place], MD  [facility]. [place] office.

## 2017-08-01 ENCOUNTER — Other Ambulatory Visit: Payer: Self-pay

## 2017-08-01 ENCOUNTER — Encounter (HOSPITAL_BASED_OUTPATIENT_CLINIC_OR_DEPARTMENT_OTHER): Payer: Self-pay | Admitting: *Deleted

## 2017-08-01 NOTE — Progress Notes (Addendum)
Spoke with patient via telephone for pre op interview. NPO after MN. No meds in AM of surgery. Needs EKG.  Pre op orders pending.

## 2017-08-02 NOTE — Progress Notes (Signed)
Pt pre-op orders are in epic.  Call and spoke w/ pt via phone. 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  So, pt will need cbc, bmet, and T&S.

## 2017-08-07 NOTE — H&P (Signed)
Office Visit   07/25/2017 [facility]    Ardell Isaacs, Forrestine Him, MD  Obstetrics and Gynecology   Genuine stress incontinence, female +1 more  Dx   Pre-op Exam    Reason for Visit   Additional Documentation   Vitals:   BP 106/62 (BP Location: Right Arm, Patient Position: Sitting, Cuff Size: Normal)   Pulse 82   Resp 14   Ht 5\' 2"  (1.575 m)   Wt 167 lb 1.6 oz (75.8 kg)   LMP 01/03/2009   BMI 30.56 kg/m   BSA 1.82 m   Flowsheets:   MEWS Score,   Anthropometrics     Encounter Info:   Billing Info,   History,   Allergies,   Detailed Report     All Notes   Progress Notes by Patton Salles, MD at 07/25/2017 10:00 AM  Author: Patton Salles, MD Author Type: Physician Filed: 07/25/2017 2:46 PM  Note Status: Signed Cosign: Cosign Not Required Encounter Date: 07/25/2017  Editor: Patton Salles, MD (Physician)  Prior Versions: 1. Jetta Lout, RN (Registered Nurse) at 07/25/2017 10:09 AM - Sign at close encounter    GYNECOLOGY  VISIT   HPI: 57 y.o.   Married  Caucasian  female   [phone] with Patient's last menstrual period was 01/03/2009.   here for   surgical consult   Has urinary stress incontinence, urinary urgency, and first degree rectocele which is asymptomatic. She leaks urine with couth, laugh, sneeze and exercise.  Her urodynamic testing confirmed stress incontinence and she had evidence of straining to void.  No difficulty with constipation or loss of control of stool. Does not have a bulge or difficulty with sexual activity.  PT has worked well to control overactive bladder.  Now up to bathroom only 1 - 2 times.   Having cataract surgery on left eye.  Told it is ok to have her surgery for her bladder following her eye surgery.  No symptoms of MS.  She can have weakness in left leg if she is stress out or tired.  GYNECOLOGIC HISTORY: Patient's last menstrual period was 01/03/2009. Contraception:   BTL/Post menopausal  Menopausal hormone therapy:  none Last mammogram:  05/04/17 density B/BIRADS 2 benign  Last pap smear:   01-09-15 negative, HR HPV negative                 OB History    Gravida  3   Para  2   Term  2   Preterm      AB  1   Living  2     SAB  1   TAB      Ectopic      Multiple      Live Births                     Patient Active Problem List   Diagnosis Date Noted  . Mixed incontinence 05/02/2017        Past Medical History:  Diagnosis Date  . Diverticulosis   . Multiple sclerosis (HCC)   . Restless leg syndrome   . Vitamin D deficiency          Past Surgical History:  Procedure Laterality Date  . BREAST SURGERY  11/08   Rt.breast Bx--Florid fibrocystic changes--high risk but negative for neoplasm  . ENDOMETRIAL ABLATION  03-07-05   -HTA  . ENDOMETRIAL BIOPSY  12-15-04   --  benign endocervical polyp--Dr. Tresa Res  . throat tumor  11/06   --benign  . TUBAL LIGATION            Current Outpatient Medications  Medication Sig Dispense Refill  . Fexofenadine-Pseudoephedrine (ALLEGRA-D PO) Take by mouth as needed.     No current facility-administered medications for this visit.      ALLERGIES: Penicillins       Family History  Problem Relation Age of Onset  . Diabetes Mother   . Hypertension Mother   . Hyperlipidemia Mother   . Cancer Mother 35       pancreatic ca  . Diabetes Father   . Hypertension Father   . Stroke Father   . Thyroid disease Sister   . Breast cancer Maternal Grandmother   . Ovarian cancer Paternal Grandmother   . Diabetes Paternal Grandmother     Social History        Socioeconomic History  . Marital status: Married    Spouse name: Not on file  . Number of children: Not on file  . Years of education: Not on file  . Highest education level: Not on file  Occupational History  . Not on file  Social Needs  . Financial resource strain: Not on file  .  Food insecurity:    Worry: Not on file    Inability: Not on file  . Transportation needs:    Medical: Not on file    Non-medical: Not on file  Tobacco Use  . Smoking status: Former Games developer  . Smokeless tobacco: Never Used  Substance and Sexual Activity  . Alcohol use: Yes    Comment: occ  . Drug use: No  . Sexual activity: Yes    Partners: Male  Lifestyle  . Physical activity:    Days per week: Not on file    Minutes per session: Not on file  . Stress: Not on file  Relationships  . Social connections:    Talks on phone: Not on file    Gets together: Not on file    Attends religious service: Not on file    Active member of club or organization: Not on file    Attends meetings of clubs or organizations: Not on file    Relationship status: Not on file  . Intimate partner violence:    Fear of current or ex partner: Not on file    Emotionally abused: Not on file    Physically abused: Not on file    Forced sexual activity: Not on file  Other Topics Concern  . Not on file  Social History Narrative  . Not on file    Review of Systems  Constitutional: Negative.   HENT: Negative.   Eyes: Negative.   Respiratory: Negative.   Cardiovascular: Negative.   Gastrointestinal: Negative.   Endocrine: Negative.   Genitourinary:       Loss of urine spontaneously Loss of urine with cough or sneeze Night urination    Musculoskeletal: Negative.   Skin: Negative.   Allergic/Immunologic: Negative.   Neurological: Negative.   Hematological: Negative.   Psychiatric/Behavioral: Negative.     PHYSICAL EXAMINATION:    BP 106/62 (BP Location: Right Arm, Patient Position: Sitting, Cuff Size: Normal)   Pulse 82   Resp 14   Ht 5\' 2"  (1.575 m)   Wt 167 lb 1.6 oz (75.8 kg)   LMP 01/03/2009   BMI 30.56 kg/m     General appearance: alert, cooperative and appears stated age Head:  Normocephalic, without obvious abnormality, atraumatic Neck: no  adenopathy, supple, symmetrical, trachea midline and thyroid normal to inspection and palpation Lungs: clear to auscultation bilaterally Heart: regular rate and rhythm Abdomen: soft, non-tender, no masses,  no organomegaly Extremities: extremities normal, atraumatic, no cyanosis or edema Skin: Skin color, texture, turgor normal. No rashes or lesions Lymph nodes: Cervical, supraclavicular, and axillary nodes normal. No abnormal inguinal nodes palpated Neurologic: Grossly normal  Pelvic: External genitalia:  no lesions              Urethra:  normal appearing urethra with no masses, tenderness or lesions              Bartholins and Skenes: normal                 Vagina: normal appearing vagina with normal color and discharge, no lesions.  Second degree rectocele.               Cervix: no lesions                Bimanual Exam:  Uterus:  normal size, contour, position, consistency, mobility, non-tender              Adnexa: no mass, fullness, tenderness              Rectal exam: Yes.  .  Confirms.              Anus:  normal sphincter tone, no lesions  Chaperone was present for exam.  ASSESSMENT  GSI.  Second degree rectocele.   PLAN  We discussed TVT midurethral sling and rectocele repair.  She understands that the TVT uses permanent mesh materials. I discussed slings being 85 - 90% effective in treatment of stress incontinence.  I discussed risks of slings including but not limited to erosions and exposure, dyspareunia, cystotomy, urinary retention and slower voiding, increase in urgency symptoms, need for prolonged catheterization or self catheterization, urinary tract infections, and recurrence of incontinence.  Rectocele may have risks of proctotomy, need for colostomy, dyspareunia, and recurrence of prolapse. She understands that surgical risks in general may include bleeding, infection, damage to surrounding organs, reaction to anesthesia, DVT, PE, death, and need for  reoperation  Surgical expectations and recovery discussed.  She wishes to proceed.   An After Visit Summary was printed and given to the patient.  __25____ minutes face to face time of which over 50% was spent in counseling.

## 2017-08-07 NOTE — Anesthesia Preprocedure Evaluation (Addendum)
Anesthesia Evaluation  Patient identified by MRN, date of birth, ID band Patient awake    Reviewed: Allergy & Precautions, NPO status , Patient's Chart, lab work & pertinent test results  History of Anesthesia Complications Negative for: history of anesthetic complications  Airway Mallampati: I  TM Distance: >3 FB Neck ROM: Full    Dental  (+) Teeth Intact, Dental Advisory Given   Pulmonary former smoker,    breath sounds clear to auscultation       Cardiovascular negative cardio ROS   Rhythm:Regular Rate:Normal     Neuro/Psych MS. Left upper and lower extremity weakness during periods of stress. Symptoms have not progressed. Not currently seeing a neurologist. Not currently on any medications. negative neurological ROS  negative psych ROS   GI/Hepatic negative GI ROS, Neg liver ROS,   Endo/Other  negative endocrine ROS  Renal/GU negative Renal ROS   Incontinence, rectocele    Musculoskeletal negative musculoskeletal ROS (+)   Abdominal   Peds  Hematology negative hematology ROS (+)   Anesthesia Other Findings Day of surgery medications reviewed with the patient.  Reproductive/Obstetrics                            Anesthesia Physical Anesthesia Plan  ASA: II  Anesthesia Plan: General   Post-op Pain Management:    Induction: Intravenous  PONV Risk Score and Plan: 2 and Ondansetron, Dexamethasone, Treatment may vary due to age or medical condition and Midazolam  Airway Management Planned: LMA  Additional Equipment:   Intra-op Plan:   Post-operative Plan: Extubation in OR  Informed Consent: I have reviewed the patients History and Physical, chart, labs and discussed the procedure including the risks, benefits and alternatives for the proposed anesthesia with the patient or authorized representative who has indicated his/her understanding and acceptance.   Dental advisory  given  Plan Discussed with: CRNA  Anesthesia Plan Comments:        Anesthesia Quick Evaluation

## 2017-08-08 ENCOUNTER — Encounter (HOSPITAL_BASED_OUTPATIENT_CLINIC_OR_DEPARTMENT_OTHER): Payer: Self-pay | Admitting: *Deleted

## 2017-08-08 ENCOUNTER — Ambulatory Visit (HOSPITAL_BASED_OUTPATIENT_CLINIC_OR_DEPARTMENT_OTHER): Payer: PRIVATE HEALTH INSURANCE | Admitting: Anesthesiology

## 2017-08-08 ENCOUNTER — Encounter (HOSPITAL_BASED_OUTPATIENT_CLINIC_OR_DEPARTMENT_OTHER)
Admission: RE | Disposition: A | Discharge status: Home / Self Care | Payer: Self-pay | Source: Ambulatory Visit | Attending: Obstetrics and Gynecology

## 2017-08-08 ENCOUNTER — Ambulatory Visit (HOSPITAL_BASED_OUTPATIENT_CLINIC_OR_DEPARTMENT_OTHER)
Admission: RE | Admit: 2017-08-08 | Discharge: 2017-08-08 | Disposition: A | Discharge status: Home / Self Care | Payer: PRIVATE HEALTH INSURANCE | Source: Ambulatory Visit | Attending: Obstetrics and Gynecology | Admitting: Obstetrics and Gynecology

## 2017-08-08 DIAGNOSIS — G35 Multiple sclerosis: Secondary | ICD-10-CM | POA: Diagnosis not present

## 2017-08-08 DIAGNOSIS — N393 Stress incontinence (female) (male): Secondary | ICD-10-CM

## 2017-08-08 DIAGNOSIS — N816 Rectocele: Secondary | ICD-10-CM | POA: Insufficient documentation

## 2017-08-08 DIAGNOSIS — Z87891 Personal history of nicotine dependence: Secondary | ICD-10-CM | POA: Insufficient documentation

## 2017-08-08 HISTORY — DX: Cataract extraction status, unspecified eye: Z98.49

## 2017-08-08 HISTORY — DX: Presence of intraocular lens: Z96.1

## 2017-08-08 HISTORY — PX: RECTOCELE REPAIR: SHX761

## 2017-08-08 HISTORY — PX: BLADDER SUSPENSION: SHX72

## 2017-08-08 HISTORY — PX: CYSTOSCOPY: SHX5120

## 2017-08-08 HISTORY — DX: Allergic rhinitis, unspecified: J30.9

## 2017-08-08 LAB — BASIC METABOLIC PANEL
Anion gap: 9 (ref 5–15)
BUN: 17 mg/dL (ref 6–20)
CHLORIDE: 106 mmol/L (ref 98–111)
CO2: 27 mmol/L (ref 22–32)
Calcium: 9.4 mg/dL (ref 8.9–10.3)
Creatinine, Ser: 0.78 mg/dL (ref 0.44–1.00)
GFR calc non Af Amer: >60 mL/min (ref >60)
Glucose, Bld: 101 mg/dL — ABNORMAL HIGH (ref 70–99)
Potassium: 4 mmol/L (ref 3.5–5.1)
SODIUM: 142 mmol/L (ref 135–145)

## 2017-08-08 LAB — CBC
HCT: 44.6 % (ref 36.0–46.0)
HEMOGLOBIN: 14.7 g/dL (ref 12.0–15.0)
MCH: 29.5 pg (ref 26.0–34.0)
MCHC: 33 g/dL (ref 30.0–36.0)
MCV: 89.6 fL (ref 78.0–100.0)
PLATELETS: 276 10*3/uL (ref 150–400)
RBC: 4.98 MIL/uL (ref 3.87–5.11)
RDW: 13 % (ref 11.5–15.5)
WBC: 5.8 10*3/uL (ref 4.0–10.5)

## 2017-08-08 LAB — TYPE AND SCREEN
ABO/RH(D): O NEG
ANTIBODY SCREEN: NEGATIVE

## 2017-08-08 LAB — ABO/RH: ABO/RH(D): O NEG

## 2017-08-08 SURGERY — URETHROPEXY, USING TRANSVAGINAL TAPE
Anesthesia: General | Site: Vagina

## 2017-08-08 MED ORDER — IBUPROFEN 800 MG PO TABS: 800.0000 mg | ORAL_TABLET | Freq: Three times a day (TID) | ORAL | 0 refills | Status: DC | PRN

## 2017-08-08 MED ORDER — PROPOFOL 10 MG/ML IV BOLUS
INTRAVENOUS | Status: DC | PRN
  Administered 2017-08-08: 150 mg via INTRAVENOUS
  Administered 2017-08-08: 50 mg via INTRAVENOUS

## 2017-08-08 MED ORDER — METRONIDAZOLE IN NACL 5-0.79 MG/ML-% IV SOLN
500.0000 mg | INTRAVENOUS | Status: AC
  Administered 2017-08-08: 500 mg via INTRAVENOUS
  Filled 2017-08-08 (×2): qty 100

## 2017-08-08 MED ORDER — LACTATED RINGERS IV SOLN
INTRAVENOUS | Status: DC
  Administered 2017-08-08: 08:00:00 via INTRAVENOUS
  Filled 2017-08-08: qty 1000

## 2017-08-08 MED ORDER — FENTANYL CITRATE (PF) 100 MCG/2ML IJ SOLN
INTRAMUSCULAR | Status: DC | PRN
  Administered 2017-08-08: 50 ug via INTRAVENOUS

## 2017-08-08 MED ORDER — DEXAMETHASONE SODIUM PHOSPHATE 10 MG/ML IJ SOLN
INTRAMUSCULAR | Status: AC
  Filled 2017-08-08: qty 1

## 2017-08-08 MED ORDER — HEMOSTATIC AGENTS (NO CHARGE) OPTIME
TOPICAL | Status: DC | PRN
  Administered 2017-08-08: 1 via TOPICAL

## 2017-08-08 MED ORDER — ONDANSETRON HCL 4 MG/2ML IJ SOLN
INTRAMUSCULAR | Status: AC
  Filled 2017-08-08: qty 2

## 2017-08-08 MED ORDER — OXYCODONE HCL 5 MG PO TABS
5.0000 mg | ORAL_TABLET | Freq: Once | ORAL | Status: AC
  Administered 2017-08-08: 5 mg via ORAL
  Filled 2017-08-08: qty 1

## 2017-08-08 MED ORDER — ARTIFICIAL TEARS OPHTHALMIC OINT
TOPICAL_OINTMENT | OPHTHALMIC | Status: AC
  Filled 2017-08-08: qty 3.5

## 2017-08-08 MED ORDER — CIPROFLOXACIN IN D5W 400 MG/200ML IV SOLN
400.0000 mg | INTRAVENOUS | Status: AC
  Administered 2017-08-08: 400 mg via INTRAVENOUS
  Filled 2017-08-08: qty 200

## 2017-08-08 MED ORDER — MIDAZOLAM HCL 2 MG/2ML IJ SOLN
INTRAMUSCULAR | Status: DC | PRN
  Administered 2017-08-08: 2 mg via INTRAVENOUS

## 2017-08-08 MED ORDER — SODIUM CHLORIDE 0.9 % IR SOLN
Status: DC | PRN
  Administered 2017-08-08: 1000 mL

## 2017-08-08 MED ORDER — OXYCODONE-ACETAMINOPHEN 5-325 MG PO TABS: 2.0000 | ORAL_TABLET | ORAL | 0 refills | Status: DC | PRN

## 2017-08-08 MED ORDER — ONDANSETRON HCL 4 MG/2ML IJ SOLN
INTRAMUSCULAR | Status: DC | PRN
  Administered 2017-08-08: 4 mg via INTRAVENOUS

## 2017-08-08 MED ORDER — LIDOCAINE-EPINEPHRINE 1 %-1:100000 IJ SOLN
INTRAMUSCULAR | Status: DC | PRN
  Administered 2017-08-08: 8 mL

## 2017-08-08 MED ORDER — PROPOFOL 10 MG/ML IV BOLUS
INTRAVENOUS | Status: AC
  Filled 2017-08-08: qty 20

## 2017-08-08 MED ORDER — LIDOCAINE 2% (20 MG/ML) 5 ML SYRINGE
INTRAMUSCULAR | Status: AC
  Filled 2017-08-08: qty 5

## 2017-08-08 MED ORDER — DEXAMETHASONE SODIUM PHOSPHATE 10 MG/ML IJ SOLN
INTRAMUSCULAR | Status: DC | PRN
  Administered 2017-08-08: 10 mg via INTRAVENOUS

## 2017-08-08 MED ORDER — FENTANYL CITRATE (PF) 100 MCG/2ML IJ SOLN
INTRAMUSCULAR | Status: AC
  Filled 2017-08-08: qty 2

## 2017-08-08 MED ORDER — MIDAZOLAM HCL 2 MG/2ML IJ SOLN
INTRAMUSCULAR | Status: AC
  Filled 2017-08-08: qty 2

## 2017-08-08 MED ORDER — LIDOCAINE 2% (20 MG/ML) 5 ML SYRINGE
INTRAMUSCULAR | Status: DC | PRN
  Administered 2017-08-08: 60 mg via INTRAVENOUS

## 2017-08-08 MED ORDER — KETOROLAC TROMETHAMINE 30 MG/ML IJ SOLN
INTRAMUSCULAR | Status: AC
  Filled 2017-08-08: qty 1

## 2017-08-08 MED ORDER — FENTANYL CITRATE (PF) 100 MCG/2ML IJ SOLN
25.0000 ug | INTRAMUSCULAR | Status: DC | PRN
  Administered 2017-08-08: 25 ug via INTRAVENOUS
  Filled 2017-08-08: qty 1

## 2017-08-08 MED ORDER — OXYCODONE HCL 5 MG PO TABS
ORAL_TABLET | ORAL | Status: AC
  Filled 2017-08-08: qty 1

## 2017-08-08 MED ORDER — CIPROFLOXACIN IN D5W 400 MG/200ML IV SOLN
INTRAVENOUS | Status: AC
  Filled 2017-08-08: qty 200

## 2017-08-08 MED ORDER — LACTATED RINGERS IV SOLN
INTRAVENOUS | Status: DC
  Filled 2017-08-08: qty 1000

## 2017-08-08 MED ORDER — KETOROLAC TROMETHAMINE 30 MG/ML IJ SOLN
INTRAMUSCULAR | Status: DC | PRN
  Administered 2017-08-08: 30 mg via INTRAVENOUS

## 2017-08-08 SURGICAL SUPPLY — 42 items
ADH SKN CLS APL DERMABOND .7 (GAUZE/BANDAGES/DRESSINGS) ×3
AGENT HMST KT MTR STRL THRMB (HEMOSTASIS)
BLADE SURG 11 STRL SS (BLADE) ×5 IMPLANT
BLADE SURG 15 STRL LF DISP TIS (BLADE) ×3 IMPLANT
BLADE SURG 15 STRL SS (BLADE) ×5
CANISTER SUCT 3000ML PPV (MISCELLANEOUS) ×5 IMPLANT
CATH FOLEY 2WAY SLVR  5CC 18FR (CATHETERS) ×2
CATH FOLEY 2WAY SLVR 5CC 18FR (CATHETERS) ×3 IMPLANT
DERMABOND ADVANCED (GAUZE/BANDAGES/DRESSINGS) ×2
DERMABOND ADVANCED .7 DNX12 (GAUZE/BANDAGES/DRESSINGS) ×3 IMPLANT
DURAPREP 26ML APPLICATOR (WOUND CARE) ×5 IMPLANT
FLOSEAL 5ML (HEMOSTASIS) ×2 IMPLANT
GLOVE BIO SURGEON STRL SZ 6.5 (GLOVE) ×4 IMPLANT
GLOVE BIO SURGEONS STRL SZ 6.5 (GLOVE) ×1
GLOVE BIOGEL PI IND STRL 7.0 (GLOVE) IMPLANT
GLOVE BIOGEL PI IND STRL 7.5 (GLOVE) IMPLANT
GLOVE BIOGEL PI IND STRL 8.5 (GLOVE) IMPLANT
GLOVE BIOGEL PI INDICATOR 7.0 (GLOVE) ×2
GLOVE BIOGEL PI INDICATOR 7.5 (GLOVE) ×2
GLOVE BIOGEL PI INDICATOR 8.5 (GLOVE) ×2
GLOVE ECLIPSE 6.5 STRL STRAW (GLOVE) ×2 IMPLANT
GLOVE INDICATOR 8.5 STRL (GLOVE) ×2 IMPLANT
GOWN STRL REUS W/TWL LRG LVL3 (GOWN DISPOSABLE) ×7 IMPLANT
NEEDLE HYPO 22GX1.5 SAFETY (NEEDLE) ×5 IMPLANT
PACK VAGINAL WOMENS (CUSTOM PROCEDURE TRAY) ×5 IMPLANT
PACKING VAGINAL (PACKING) IMPLANT
PAD MAGNETIC INST (MISCELLANEOUS) IMPLANT
SET IRRIG Y TYPE TUR BLADDER L (SET/KITS/TRAYS/PACK) ×5 IMPLANT
SLING TVT EXACT (Sling) ×2 IMPLANT
SURGIFLO W/THROMBIN 8M KIT (HEMOSTASIS) IMPLANT
SUT VIC AB 0 CT1 18XCR BRD8 (SUTURE) IMPLANT
SUT VIC AB 0 CT1 27 (SUTURE)
SUT VIC AB 0 CT1 27XBRD ANBCTR (SUTURE) IMPLANT
SUT VIC AB 0 CT1 8-18 (SUTURE) ×5
SUT VIC AB 2-0 CT2 27 (SUTURE) IMPLANT
SUT VIC AB 2-0 SH 27 (SUTURE) ×15
SUT VIC AB 2-0 SH 27XBRD (SUTURE) ×3 IMPLANT
TOWEL OR 17X24 6PK STRL BLUE (TOWEL DISPOSABLE) ×10 IMPLANT
TRAY FOLEY W/BAG SLVR 14FR (SET/KITS/TRAYS/PACK) ×5 IMPLANT
TUBE CONNECTING 12'X1/4 (SUCTIONS) ×1
TUBE CONNECTING 12X1/4 (SUCTIONS) ×4 IMPLANT
floseal 5 ml ×2 IMPLANT

## 2017-08-08 NOTE — Discharge Instructions (Signed)
Anterior and Posterior Colporrhaphy and Sling Procedure, Care After This sheet gives you information about how to care for yourself after your procedure. Your health care provider may also give you more specific instructions. If you have problems or questions, contact your health care provider. What can I expect after the procedure? After the procedure, it is common to have:  Pain in the surgical area.  Vaginal discharge. You will need to use a sanitary pad during this time.  Fatigue.  Follow these instructions at home: Incision care  Follow instructions from your health care provider about how to take care of your incision. Make sure you: ? Wash your hands with soap and water before touching the incision area. If soap and water are not available, use hand sanitizer. ? Clean your incision as told by your health care provider. ? Leave stitches (sutures), skin glue, or adhesive strips in place. These skin closures may need to stay in place for 2 weeks or longer. If adhesive strip edges start to loosen and curl up, you may trim the loose edges. Do not remove adhesive strips completely unless your health care provider tells you to do that.  Check your incision area every day for signs of infection. Check for: ? Redness, swelling, or pain. ? Fluid or blood. ? Warmth. ? Pus or a bad smell.  Check your incision every day to make sure the incision area is not separating or opening.  Do not take baths, swim, or use a hot tub until your health care provider approves. You may shower.  Keep the area between your vagina and rectum (perineal area) clean and dry. Make sure you clean the area after each bowel movement and each time you urinate.  Ask your health care provider if you can take a sitz bath or sit in a tub of clean, warm water. Activity  Do gentle, daily activity as told by your health care provider. You may be told to take short walks every day and go farther each time. Ask your  health care provider what activities are safe for you.  Limit stair climbing to once or twice a day in the first week, then slowly increase this activity.  Do not lift anything that is heavier than 10 lbs. (4.5 kg), or the limit that your health care provider tells you, until he or she says that it is safe. Avoid pushing or pulling motions.  Avoid standing for long periods of time.  Do not douche, use tampons, or have sex until your health care provider says it is okay.  Do not drive or use heavy machinery while taking prescription pain medicine. To prevent constipation  To prevent or treat constipation while you are taking prescription pain medicine, your health care provider may recommend that you: ? Take over-the-counter or prescription medicines. ? Eat foods that are high in fiber, such as fresh fruits and vegetables, whole grains, and beans. ? Drink enough fluid to keep your urine clear or pale yellow. ? Limit foods that are high in fat and processed sugars, such as fried and sweet foods. General instructions  You may be instructed to do pelvic floor exercises (kegels) as told by your health care provider.  Take over-the-counter and prescription medicines only as told by your health care provider.  Keep all follow-up visits as told by your health care provider. This is important. Contact a health care provider if:  Medicine does not help your pain.  You have frequent or urgent urination, or  you are unable to completely empty your bladder.  You feel a burning sensation when urinating.  You have fluid or blood coming from your incision.  You have pus or a bad smell coming from the incision.  Your incision feels warm to the touch.  You have redness, swelling, or pain around your incision. Get help right away if:  You have a fever or chills.  Your incision separates or opens.  You cannot urinate.  You have trouble breathing. Summary  After the procedure, it is  common to have pain, fatigue, and discharge from the vagina.  Keep the area between your vagina and rectum (perineal area) clean and dry. Make sure you clean the area after each bowel movement and each time you urinate.  Follow instructions from your health care provider on any activity restrictions after the procedure. This information is not intended to replace advice given to you by your health care provider. Make sure you discuss any questions you have with your health care provider. Document Released: 08/04/2003 Document Revised: 12/21/2015 Document Reviewed: 12/21/2015 Elsevier Interactive Patient Education  2017 [employer]   Post Anesthesia Home Care Instructions  Activity: Get plenty of rest for the remainder of the day. A responsible individual must stay with you for 24 hours following the procedure.  For the next 24 hours, DO NOT: -Drive a car -Advertising copywriter -Drink alcoholic beverages -Take any medication unless instructed by your physician -Make any legal decisions or sign important papers.  Meals: Start with liquid foods such as gelatin or soup. Progress to regular foods as tolerated. Avoid greasy, spicy, heavy foods. If nausea and/or vomiting occur, drink only clear liquids until the nausea and/or vomiting subsides. Call your physician if vomiting continues.  Special Instructions/Symptoms: Your throat may feel dry or sore from the anesthesia or the breathing tube placed in your throat during surgery. If this causes discomfort, gargle with warm salt water. The discomfort should disappear within 24 hours.  If you had a scopolamine patch placed behind your ear for the management of post- operative nausea and/or vomiting:  1. The medication in the patch is effective for 72 hours, after which it should be removed.  Wrap patch in a tissue and discard in the trash. Wash hands thoroughly with soap and water. 2. You may remove the patch earlier than 72 hours if you  experience unpleasant side effects which may include dry mouth, dizziness or visual disturbances. 3. Avoid touching the patch. Wash your hands with soap and water after contact with the patch.    Indwelling Urinary Catheter Care, Adult Take good care of your catheter to keep it working and to prevent problems. How to wear your catheter Attach your catheter to your leg with tape (adhesive tape) or a leg strap. Make sure it is not too tight. If you use tape, remove any bits of tape that are already on the catheter. How to wear a drainage bag You should have:  A large overnight bag.  A small leg bag.  Overnight Bag You may wear the overnight bag at any time. Always keep the bag below the level of your bladder but off the floor. When you sleep, put a clean plastic bag in a wastebasket. Then hang the bag inside the wastebasket. Leg Bag Never wear the leg bag at night. Always wear the leg bag below your knee. Keep the leg bag secure with a leg strap or tape. How to care for your skin  Clean the skin  around the catheter at least once every day.  Shower every day. Do not take baths.  Put creams, lotions, or ointments on your genital area only as told by your doctor.  Do not use powders, sprays, or lotions on your genital area. How to clean your catheter and your skin 1. Wash your hands with soap and water. 2. Wet a washcloth in warm water and gentle (mild) soap. 3. Use the washcloth to clean the skin where the catheter enters your body. Clean downward and wipe away from the catheter in small circles. Do not wipe toward the catheter. 4. Pat the area dry with a clean towel. Make sure to clean off all soap. How to care for your drainage bags Empty your drainage bag when it is ?- full or at least 2-3 times a day. Replace your drainage bag once a month or sooner if it starts to smell bad or look dirty. Do not clean your drainage bag unless told by your doctor. Emptying a drainage  bag  Supplies Needed  Rubbing alcohol.  Gauze pad or cotton ball.  Tape or a leg strap.  Steps 1. Wash your hands with soap and water. 2. Separate (detach) the bag from your leg. 3. Hold the bag over the toilet or a clean container. Keep the bag below your hips and bladder. This stops pee (urine) from going back into the tube. 4. Open the pour spout at the bottom of the bag. 5. Empty the pee into the toilet or container. Do not let the pour spout touch any surface. 6. Put rubbing alcohol on a gauze pad or cotton ball. 7. Use the gauze pad or cotton ball to clean the pour spout. 8. Close the pour spout. 9. Attach the bag to your leg with tape or a leg strap. 10. Wash your hands.  Changing a drainage bag Supplies Needed  Alcohol wipes.  A clean drainage bag.  Adhesive tape or a leg strap.  Steps 1. Wash your hands with soap and water. 2. Separate the dirty bag from your leg. 3. Pinch the rubber catheter with your fingers so that pee does not spill out. 4. Separate the catheter tube from the drainage tube where these tubes connect (at the connection valve). Do not let the tubes touch any surface. 5. Clean the end of the catheter tube with an alcohol wipe. Use a different alcohol wipe to clean the end of the drainage tube. 6. Connect the catheter tube to the drainage tube of the clean bag. 7. Attach the new bag to the leg with adhesive tape or a leg strap. 8. Wash your hands.  How to prevent infection and other problems  Never pull on your catheter or try to remove it. Pulling can damage tissue in your body.  Always wash your hands before and after touching your catheter.  If a leg strap gets wet, replace it with a dry one.  Drink enough fluids to keep your pee clear or pale yellow, or as told by your doctor.  Do not let the drainage bag or tubing touch the floor.  Wear cotton underwear.  If you are female, wipe from front to back after you poop (have a bowel  movement).  Check on the catheter often to make sure it works and the tubing is not twisted. Get help if:  Your pee is cloudy.  Your pee smells unusually bad.  Your pee is not draining into the bag.  Your tube gets clogged.  Your catheter starts to leak.  Your bladder feels full. Get help right away if:  You have redness, swelling, or pain where the catheter enters your body.  You have fluid, pus, or a bad smell coming from the area where the catheter enters your body.  The area where the catheter enters your body feels warm.  You have a fever.  You have pain in your: ? Stomach (abdomen). ? Legs. ? Lower back. ? Bladder.  You see blood fill the catheter.  Your pee is pink or red.  You feel sick to your stomach (nauseous).  You throw up (vomit).  You have chills.  Your catheter gets pulled out. This information is not intended to replace advice given to you by your health care provider. Make sure you discuss any questions you have with your health care provider. Document Released: 04/16/2012 Document Revised: 11/18/2015 Document Reviewed: 06/04/2013 Elsevier Interactive Patient Education  [employer].

## 2017-08-08 NOTE — Anesthesia Postprocedure Evaluation (Signed)
Anesthesia Post Note  Patient: Gabrielle Patterson  Procedure(s) Performed: TRANSVAGINAL TAPE (TVT) PROCEDURE exact midurethral sling (N/A Vagina ) CYSTOSCOPY (N/A Bladder) POSTERIOR REPAIR (RECTOCELE) (N/A Perineum)     Patient location during evaluation: PACU Anesthesia Type: General Level of consciousness: awake and alert Pain management: pain level controlled Vital Signs Assessment: post-procedure vital signs reviewed and stable Respiratory status: spontaneous breathing, nonlabored ventilation, respiratory function stable and patient connected to nasal cannula oxygen Cardiovascular status: blood pressure returned to baseline and stable Postop Assessment: no apparent nausea or vomiting Anesthetic complications: no    Last Vitals:  Vitals:   08/08/17 1215 08/08/17 1230  BP: 117/77 (!) 152/77  Pulse: 61 66  Resp: 11 13  Temp:    SpO2: 97% 98%    Last Pain:  Vitals:   08/08/17 1345  TempSrc:   PainSc: 2                  Kimia Finan L Aneliz Carbary

## 2017-08-08 NOTE — Progress Notes (Signed)
#  14 foley inserted-clear yellow urine returned-600 cc Bladder pressure resolved -instructions for care to patient and husband.To office Friday for catheter removal.

## 2017-08-08 NOTE — Progress Notes (Signed)
Update to History and Physical  Ready for surgery.  Would like discharge to home today.  Patient examined.  OK to proceed with surgery.

## 2017-08-08 NOTE — Transfer of Care (Signed)
Immediate Anesthesia Transfer of Care Note  Patient: Gabrielle Patterson  Procedure(s) Performed: TRANSVAGINAL TAPE (TVT) PROCEDURE exact midurethral sling (N/A Vagina ) CYSTOSCOPY (N/A Bladder) POSTERIOR REPAIR (RECTOCELE) (N/A Perineum)  Patient Location: PACU  Anesthesia Type:General  Level of Consciousness: sedated and patient cooperative  Airway & Oxygen Therapy: Patient Spontanous Breathing and Patient connected to nasal cannula oxygen  Post-op Assessment: Report given to RN  Post vital signs: Reviewed and stable  Last Vitals:  Vitals Value Taken Time  BP    Temp    Pulse 77 08/08/2017 11:24 AM  Resp 10 08/08/2017 11:24 AM  SpO2 97 % 08/08/2017 11:24 AM  Vitals shown include unvalidated device data.  Last Pain:  Vitals:   08/08/17 0713  TempSrc: Oral         Complications: No apparent anesthesia complications

## 2017-08-08 NOTE — Anesthesia Procedure Notes (Signed)
Procedure Name: LMA Insertion Date/Time: 08/08/2017 9:30 AM Performed by: Tyrone Nine, CRNA Pre-anesthesia Checklist: Patient identified, Timeout performed, Emergency Drugs available, Suction available and Patient being monitored Patient Re-evaluated:Patient Re-evaluated prior to induction Oxygen Delivery Method: Circle system utilized Preoxygenation: Pre-oxygenation with 100% oxygen Induction Type: IV induction Ventilation: Mask ventilation without difficulty LMA: LMA inserted LMA Size: 4.0 Number of attempts: 1 Airway Equipment and Method: Bite block (gauze b/t central incisors and LMA) Placement Confirmation: breath sounds checked- equal and bilateral,  CO2 detector and positive ETCO2 Tube secured with: Tape Dental Injury: Teeth and Oropharynx as per pre-operative assessment

## 2017-08-08 NOTE — Op Note (Signed)
OPERATIVE REPORT   PREOPERATIVE DIAGNOSES:  Genuine stress incontinence, rectocele.  POSTOPERATIVE DIAGNOSES:  Genuine stress incontinence, rectocele.  PROCEDURES:   TVT Exact midurethral sling, cystoscopy, posterior colporrhaphy.  SURGEON:  Randye Lobo, M.D.  ASSISTANT:   Jerene Bears, M.D.  ANESTHESIA:  General endotracheal, local with 1% lidocaine with epinephrine, 1:100,000.  EBL:  25 cc.   URINE OUTPUT:  150 cc.  IV FLUIDS:   1000 cc LR.  COMPLICATIONS:  None.  INDICATIONS FOR THE PROCEDURE:  The patient is a 57 year old  Gravida 3, Para 24 Caucasian female, who presents with urinary incontinence  with a cough, laugh, sneeze, or exercise.  On physical exam,  the patient was  noted to have good anterior wall and uterine support and a second degree rectocele.  The patient underwent multichannel urodynamic testing, and she was diagnosed with  genuine stress incontinence.  The patient is wishing for surgical repair and a plan is  made to proceed now with a posterior colporrhaphy along with the TVT Exact  midurethral sling and cystoscopy.  Risks, benefits, and alternatives are reviewed with  the patient, who wishes to proceed.  FINDINGS:  Examination under anesthesia revealed a minor cystocele and a second  degree high rectocele.  The uterus was well supported.    The bladder was visualized throughout 360 degrees and was normal.  There  was no foreign body in the bladder or the urethra.  The ureters were noted to  be patent bilaterally.  SPECIMENS:  None.  DESCRIPTION OF PROCEDURE:  The patient was reidentified in the preoperative hold area.  She received IV Flagyl and Ciprofloxacin for antibiotic  prophylaxis.  She received TED hose and PAS stockings for DVT prophylaxis.  The patient was transferred to the operating room where she was placed in the dorsal lithotomy position with Allen stirrups.  General endotracheal anesthesia was induced.  The patient's lower  abdomen, vagina and perineum were then sterilely prepped and she was draped.  A Foley catheter was sterilely placed inside the bladder and left to gravity drainage  throughout the procedure and was removed at the termination of the procedure.  An examination under anesthesia was performed.  Allis clamps were used to mark the anterior vaginal wall from 1 cm below the urethra to the vaginal apex.  The anterior vaginal wall mucosa was injected locally with 1% lidocaine with epinephrine, 1:100,000.  The vaginal mucosa was then incised vertically in the midline with the scalpel.  With a combination of sharp and blunt dissection, the subvaginal tissue was dissected off the bladder bilaterally.  The dissection was carried back to the pubic rami anteriorly.    The TVT Exact midurethral sling was performed.  The 1 cm suprapubic incisions were created with a scalpel to the right and left of the midline.  The TVT Exact was performed in a bottom-up fashion. The Foley catheter was removed and the Foley tip with the obturator guide was placed inside the urethra and deflected properly.  The guide was placed through the right retropubic space and then up through the right suprapubic incision.  This was performed without difficulty.  The urethra was deflected in opposite direction and the same was then performed on the patient's left-hand side.  The obturator guide was removed and cystoscopy was performed and the findings were as noted above.  All cystoscopic fluid was drained and the Foley catheter was replaced. The sling was brought up through the suprapubic incisions bilaterally. A Tresa Endo  clamp was placed between the sling and the urethra, and the plastic sheaths were removed.  The sling was trimmed suprapubically. The sling was noted to be in good position.   Surgiflo was then placed over bladder and into the sites where the midurethral  sling went into the retropubic space.   Hemostasis was  good.  The anterior vaginal wall mucosa was trimmed and then the anterior vaginal wall was closed with a running locked suture of 2-0 Vicryl.  The posterior colporrhaphy was performed at this time.  Allis clamps were used to mark the perineal body and then the posterior vaginal mucosa up to the level of the vaginal apex.  The perineal body and mucosa were injected locally with 1% lidocaine with epinephrine, 1:100,000.  A triangular wedge of epithelium was excised from the perineal body and the posterior mucosa was incised vertically in the midline with a Metzenbaum scissors.  Sharp and blunt dissection were used to dissect the perirectal fascia off the vaginal mucosa bilaterally.  The rectocele was reduced at this time.  Vertical mattress  sutures of 0 Vicryl were then used.  The rectocele reduced nicely.   Excess vaginal mucosa was trimmed at this time and the posterior vaginal wall was closed with a running lock suture of 2-0 Vicryl down to the hymen.  This suture was then brought behind the hymen.  A crown stitch of 0 Vicryl was placed.  The 2-0 Vicryl was used to then place running sutures along the transverse superficial perineal muscles.  This suture was then brought up the perineum in a subcuticular fashion and the knot was tied at the hymen as for an episiotomy-type repair.  Rectal exam was performed and there was no evidence of any suture or foreign body in the rectum.  There was good support and elevation to the anterior apical and posterior vaginal walls.    The suprapubic incisions were closed with Dermabond.  The patient was awakened and extubated, and escorted to the recovery room in stable condition.  There were no complications.  All needle, instrument, and sponge counts were correct.   Randye Lobo, M.D.

## 2017-08-10 ENCOUNTER — Encounter (HOSPITAL_BASED_OUTPATIENT_CLINIC_OR_DEPARTMENT_OTHER): Payer: Self-pay | Admitting: Obstetrics and Gynecology

## 2017-08-10 NOTE — Progress Notes (Signed)
GYNECOLOGY  VISIT   HPI: 57 y.o.   Married  Caucasian  female   [phone] with Patient's last menstrual period was 01/03/2009.   here for post op transvaginal tape procedure and cath removal.  Surgery was done 08/08/2017. Voided well twice in PACU but did have a residual of 600 cc.   Taking Percocet every 4 hours and Motrin every 8 hours.  States that her pain is more near her bladder and less near the rectum.  Sedentary activity level.  Just sleeping.   No BM.  Using Miralax daily.   Little bit of vaginal bleeding.   Has done pelvic floor therapy prior to surgery for overactive bladder and has had less trips to the bathroom at night due to this.   GYNECOLOGIC HISTORY: Patient's last menstrual period was 01/03/2009. Contraception:  Tubal ligation and post menopausal status Menopausal hormone therapy:  none Last mammogram:  05/04/2017 BIRAD 2: negative Last pap smear:   01/19/2015 pap and HR HPV negative        OB History    Gravida  3   Para  2   Term  2   Preterm      AB  1   Living  2     SAB  1   TAB      Ectopic      Multiple      Live Births                 Patient Active Problem List   Diagnosis Date Noted  . Hypercholesterolemia 07/31/2017  . Class 1 obesity with body mass index (BMI) of 30.0 to 30.9 in adult 07/31/2017  . Rectocele 07/25/2017  . Mixed incontinence 05/02/2017    Past Medical History:  Diagnosis Date  . Allergic rhinitis   . Allergy   . Diverticulosis   . History of cataract removal with insertion of prosthetic lens 2019   Bilateral  . Multiple sclerosis (HCC)   . Urine incontinence   . Vitamin D deficiency     Past Surgical History:  Procedure Laterality Date  . BLADDER SUSPENSION N/A 08/08/2017   Procedure: TRANSVAGINAL TAPE (TVT) PROCEDURE exact midurethral sling;  Surgeon: Patton Salles, MD;  Location: [facility];  Service: Gynecology;  Laterality: N/A;  . BREAST SURGERY  11/08   Rt.breast Bx--Florid fibrocystic changes--high risk but negative for neoplasm  . CYSTOSCOPY N/A 08/08/2017   Procedure: CYSTOSCOPY;  Surgeon: Patton Salles, MD;  Location: [facility];  Service: Gynecology;  Laterality: N/A;  . ENDOMETRIAL ABLATION  03-07-05   -HTA  . ENDOMETRIAL BIOPSY  12-15-04   --benign endocervical polyp--Dr. Tresa Res  . RECTOCELE REPAIR N/A 08/08/2017   Procedure: POSTERIOR REPAIR (RECTOCELE);  Surgeon: Patton Salles, MD;  Location: [facility];  Service: Gynecology;  Laterality: N/A;  . throat tumor  11/06   --benign  . TUBAL LIGATION      Current Outpatient Medications  Medication Sig Dispense Refill  . Fexofenadine-Pseudoephedrine (ALLEGRA-D PO) Take by mouth as needed.    Marland Kitchen ibuprofen (ADVIL,MOTRIN) 800 MG tablet Take 1 tablet (800 mg total) by mouth every 8 (eight) hours as needed. 30 tablet 0  . oxyCODONE-acetaminophen (PERCOCET) 5-325 MG tablet Take 2 tablets by mouth every 4 (four) hours as needed. use only as much as needed to relieve pain 30 tablet 0   No current facility-administered medications for this visit.  ALLERGIES: Penicillins  Family History  Problem Relation Age of Onset  . Diabetes Mother   . Hypertension Mother   . Hyperlipidemia Mother   . Cancer Mother 56       pancreatic ca  . Diabetes Father   . Hypertension Father   . Stroke Father   . Hearing loss Father   . Heart attack Father   . Kidney disease Father   . Thyroid disease Sister   . Breast cancer Maternal Grandmother   . Cancer Maternal Grandmother   . Ovarian cancer Paternal Grandmother   . Diabetes Paternal Grandmother   . Cancer Paternal Grandmother     Social History   Socioeconomic History  . Marital status: Married    Spouse name: Not on file  . Number of children: 2  . Years of education: Not on file  . Highest education level: Not on file  Occupational History  . Not on file  Social Needs  . Financial  resource strain: Not on file  . Food insecurity:    Worry: Not on file    Inability: Not on file  . Transportation needs:    Medical: Not on file    Non-medical: Not on file  Tobacco Use  . Smoking status: Former Games developer  . Smokeless tobacco: Never Used  Substance and Sexual Activity  . Alcohol use: Yes    Comment: occ  . Drug use: No  . Sexual activity: Yes    Partners: Male  Lifestyle  . Physical activity:    Days per week: Not on file    Minutes per session: Not on file  . Stress: Not on file  Relationships  . Social connections:    Talks on phone: Not on file    Gets together: Not on file    Attends religious service: Not on file    Active member of club or organization: Not on file    Attends meetings of clubs or organizations: Not on file    Relationship status: Not on file  . Intimate partner violence:    Fear of current or ex partner: Not on file    Emotionally abused: Not on file    Physically abused: Not on file    Forced sexual activity: Not on file  Other Topics Concern  . Not on file  Social History Narrative  . Not on file    Review of Systems  Constitutional: Negative.   HENT: Negative.   Eyes: Negative.   Respiratory: Negative.   Cardiovascular: Negative.   Gastrointestinal: Negative.   Endocrine: Negative.   Genitourinary: Negative.   Musculoskeletal: Negative.   Skin: Negative.   Allergic/Immunologic: Negative.   Neurological: Negative.   Hematological: Negative.   Psychiatric/Behavioral: Negative.   All other systems reviewed and are negative.   PHYSICAL EXAMINATION:    BP 110/70   Pulse 67   Ht 5\' 2"  (1.575 m)   Wt 168 lb (76.2 kg)   LMP 01/03/2009   BMI 30.73 kg/m     General appearance: alert, cooperative and appears stated age    Pelvic: External genitalia:  Mons pubis with ecchymoses.  Sutures intact.               Urethra:  normal appearing urethra with no masses, tenderness or lesions             Retrograde filling of  250 cc sterile normal saline.  Void - 50 cc.   Cath for PVR - 250 cc.  14 french foley with catheter tip placed sterily after betadine prep.   Chaperone was present for exam.  ASSESSMENT  Post op urinary retention. Hx overative bladder.   PLAN  Instructed in use of Foley with cath tip. Try to wean off percocet.  Try Senakot - S. Fu in 3 days for another voiding trial.    An After Visit Summary was printed and given to the patient.

## 2017-08-11 ENCOUNTER — Ambulatory Visit (INDEPENDENT_AMBULATORY_CARE_PROVIDER_SITE_OTHER): Payer: PRIVATE HEALTH INSURANCE | Admitting: Obstetrics and Gynecology

## 2017-08-11 ENCOUNTER — Encounter: Payer: Self-pay | Admitting: Obstetrics and Gynecology

## 2017-08-11 VITALS — BP 110/70 | HR 67 | Ht 62.0 in | Wt 168.0 lb

## 2017-08-11 DIAGNOSIS — R338 Other retention of urine: Secondary | ICD-10-CM | POA: Diagnosis not present

## 2017-08-11 DIAGNOSIS — N9989 Other postprocedural complications and disorders of genitourinary system: Secondary | ICD-10-CM | POA: Diagnosis not present

## 2017-08-11 NOTE — Patient Instructions (Signed)
Empty your Foley catheter at least every 3 hours.

## 2017-08-14 ENCOUNTER — Encounter: Payer: Self-pay | Admitting: Obstetrics and Gynecology

## 2017-08-14 ENCOUNTER — Ambulatory Visit (INDEPENDENT_AMBULATORY_CARE_PROVIDER_SITE_OTHER): Payer: PRIVATE HEALTH INSURANCE | Admitting: Obstetrics and Gynecology

## 2017-08-14 VITALS — BP 118/76 | HR 68 | Temp 97.9°F | Resp 16 | Ht 62.0 in | Wt 169.0 lb

## 2017-08-14 DIAGNOSIS — R339 Retention of urine, unspecified: Secondary | ICD-10-CM

## 2017-08-14 NOTE — Progress Notes (Signed)
GYNECOLOGY  VISIT   HPI: 57 y.o.   Married  Caucasian  female   [phone] with Patient's last menstrual period was 01/03/2009.   here for 1 week post op. Patient states that she has had several bowel movements. Per patient, pain with catheter.  TRANSVAGINAL TAPE (TVT) PROCEDURE exact midurethral sling (N/A Vagina ) CYSTOSCOPY (N/A Bladder) POSTERIOR REPAIR (RECTOCELE) (N/A Perineum)    Voided twice in recovery room but had a residual of 600 cc.  Had voiding trial last week and had back filling of 250 cc and could only void 50 cc so foley replaced for the weekend.   GYNECOLOGIC HISTORY: Patient's last menstrual period was 01/03/2009. Contraception:  Tubal ligation and postmenopausal Menopausal hormone therapy:  none Last mammogram:  05/04/2017 BIRAD 2: negative Last pap smear:   01/19/15 Neg:Neg HR HPV negative        OB History    Gravida  3   Para  2   Term  2   Preterm      AB  1   Living  2     SAB  1   TAB      Ectopic      Multiple      Live Births                 Patient Active Problem List   Diagnosis Date Noted  . Hypercholesterolemia 07/31/2017  . Class 1 obesity with body mass index (BMI) of 30.0 to 30.9 in adult 07/31/2017  . Rectocele 07/25/2017  . Mixed incontinence 05/02/2017    Past Medical History:  Diagnosis Date  . Allergic rhinitis   . Allergy   . Diverticulosis   . History of cataract removal with insertion of prosthetic lens 2019   Bilateral  . Multiple sclerosis (HCC)   . Urine incontinence   . Vitamin D deficiency     Past Surgical History:  Procedure Laterality Date  . BLADDER SUSPENSION N/A 08/08/2017   Procedure: TRANSVAGINAL TAPE (TVT) PROCEDURE exact midurethral sling;  Surgeon: Patton Salles, MD;  Location: [facility];  Service: Gynecology;  Laterality: N/A;  . BREAST SURGERY  11/08   Rt.breast Bx--Florid fibrocystic changes--high risk but negative for neoplasm  . CYSTOSCOPY N/A 08/08/2017    Procedure: CYSTOSCOPY;  Surgeon: Patton Salles, MD;  Location: [facility];  Service: Gynecology;  Laterality: N/A;  . ENDOMETRIAL ABLATION  03-07-05   -HTA  . ENDOMETRIAL BIOPSY  12-15-04   --benign endocervical polyp--Dr. Tresa Res  . RECTOCELE REPAIR N/A 08/08/2017   Procedure: POSTERIOR REPAIR (RECTOCELE);  Surgeon: Patton Salles, MD;  Location: [facility];  Service: Gynecology;  Laterality: N/A;  . throat tumor  11/06   --benign  . TUBAL LIGATION      Current Outpatient Medications  Medication Sig Dispense Refill  . Fexofenadine-Pseudoephedrine (ALLEGRA-D PO) Take by mouth as needed.    Marland Kitchen ibuprofen (ADVIL,MOTRIN) 800 MG tablet Take 1 tablet (800 mg total) by mouth every 8 (eight) hours as needed. 30 tablet 0  . oxyCODONE-acetaminophen (PERCOCET) 5-325 MG tablet Take 2 tablets by mouth every 4 (four) hours as needed. use only as much as needed to relieve pain 30 tablet 0   No current facility-administered medications for this visit.      ALLERGIES: Penicillins  Family History  Problem Relation Age of Onset  . Diabetes Mother   . Hypertension Mother   . Hyperlipidemia Mother   .  Cancer Mother 87       pancreatic ca  . Diabetes Father   . Hypertension Father   . Stroke Father   . Hearing loss Father   . Heart attack Father   . Kidney disease Father   . Thyroid disease Sister   . Breast cancer Maternal Grandmother   . Cancer Maternal Grandmother   . Ovarian cancer Paternal Grandmother   . Diabetes Paternal Grandmother   . Cancer Paternal Grandmother     Social History   Socioeconomic History  . Marital status: Married    Spouse name: Not on file  . Number of children: 2  . Years of education: Not on file  . Highest education level: Not on file  Occupational History  . Not on file  Social Needs  . Financial resource strain: Not on file  . Food insecurity:    Worry: Not on file    Inability: Not on file  .  Transportation needs:    Medical: Not on file    Non-medical: Not on file  Tobacco Use  . Smoking status: Former Games developer  . Smokeless tobacco: Never Used  Substance and Sexual Activity  . Alcohol use: Yes    Comment: occ  . Drug use: No  . Sexual activity: Not Currently    Partners: Male    Birth control/protection: Post-menopausal  Lifestyle  . Physical activity:    Days per week: Not on file    Minutes per session: Not on file  . Stress: Not on file  Relationships  . Social connections:    Talks on phone: Not on file    Gets together: Not on file    Attends religious service: Not on file    Active member of club or organization: Not on file    Attends meetings of clubs or organizations: Not on file    Relationship status: Not on file  . Intimate partner violence:    Fear of current or ex partner: Not on file    Emotionally abused: Not on file    Physically abused: Not on file    Forced sexual activity: Not on file  Other Topics Concern  . Not on file  Social History Narrative  . Not on file    Review of Systems  Genitourinary:       Pain with catheter  All other systems reviewed and are negative.   PHYSICAL EXAMINATION:    BP 118/76 (BP Location: Right Arm, Patient Position: Sitting, Cuff Size: Normal)   Pulse 68   Temp 97.9 F (36.6 C) (Oral)   Resp 16   Ht 5\' 2"  (1.575 m)   Wt 169 lb (76.7 kg)   LMP 01/03/2009   BMI 30.91 kg/m     General appearance: alert, cooperative and appears stated age    Pelvic: External genitalia: SP incisions intact with ecchymoses and no induration.               Urethra:  normal appearing urethra with no masses, tenderness or lesions              Bartholins and Skenes: normal                 Vagina: normal appearing vagina with normal color and discharge, no lesions.  Suture lines intact and no agglutination.  Sling protected.              Cervix: no lesions  Bimanual Exam:  Uterus:  normal size, contour,  position, consistency, mobility, non-tender              Adnexa: no mass, fullness, tenderness       Bladder back filled with 250 cc sterile normal saline.  Voided 100 cc.   Taught intermittent self cath and 275 cc removed.  Patient able to do self catheterization.   Chaperone was present for exam.  ASSESSMENT  Urinary retention.   PLAN  Voiding but still with some residual volume. Given cath tips and guide for cathing.  Call if needing to cath regularly.  FU for 6 week visit and prn.    An After Visit Summary was printed and given to the patient.

## 2017-08-15 ENCOUNTER — Ambulatory Visit: Payer: Self-pay | Admitting: Obstetrics and Gynecology

## 2017-09-15 NOTE — Progress Notes (Signed)
GYNECOLOGY  VISIT   HPI: 57 y.o.   Married  Caucasian  female   [phone] with Patient's last menstrual period was 01/03/2009.   here for post op.    TVT Exact midurethral sling, cystoscopy, posterior colporrhaphy 08/08/17. Voiding normally.  Only did urethral catheterization 3 times after here for her last visit.  Bowel function can be more constipated.  Using stool softeners. No vaginal drainage.  She states no problems what so ever.   Doing Kegel's.   Traveling 10/29 - [place].  GYNECOLOGIC HISTORY: Patient's last menstrual period was 01/03/2009. Contraception:  Tubal ligation and postmenopausal Menopausal hormone therapy:  none Last mammogram:  05/04/2017 BIRAD 2: negative Last pap smear:   01/19/15 Neg:Neg HR HPV negative        OB History    Gravida  3   Para  2   Term  2   Preterm      AB  1   Living  2     SAB  1   TAB      Ectopic      Multiple      Live Births                 Patient Active Problem List   Diagnosis Date Noted  . Hypercholesterolemia 07/31/2017  . Class 1 obesity with body mass index (BMI) of 30.0 to 30.9 in adult 07/31/2017  . Mixed incontinence 05/02/2017    Past Medical History:  Diagnosis Date  . Allergic rhinitis   . Allergy   . Diverticulosis   . History of cataract removal with insertion of prosthetic lens 2019   Bilateral  . Multiple sclerosis (HCC)   . Urine incontinence   . Vitamin D deficiency     Past Surgical History:  Procedure Laterality Date  . BLADDER SUSPENSION N/A 08/08/2017   Procedure: TRANSVAGINAL TAPE (TVT) PROCEDURE exact midurethral sling;  Surgeon: Patton Salles, MD;  Location: [facility];  Service: Gynecology;  Laterality: N/A;  . BREAST SURGERY  11/08   Rt.breast Bx--Florid fibrocystic changes--high risk but negative for neoplasm  . CYSTOSCOPY N/A 08/08/2017   Procedure: CYSTOSCOPY;  Surgeon: Patton Salles, MD;  Location: [facility];   Service: Gynecology;  Laterality: N/A;  . ENDOMETRIAL ABLATION  03-07-05   -HTA  . ENDOMETRIAL BIOPSY  12-15-04   --benign endocervical polyp--Dr. Tresa Res  . RECTOCELE REPAIR N/A 08/08/2017   Procedure: POSTERIOR REPAIR (RECTOCELE);  Surgeon: Patton Salles, MD;  Location: [facility];  Service: Gynecology;  Laterality: N/A;  . throat tumor  11/06   --benign  . TUBAL LIGATION      No current outpatient medications on file.   No current facility-administered medications for this visit.      ALLERGIES: Penicillins  Family History  Problem Relation Age of Onset  . Diabetes Mother   . Hypertension Mother   . Hyperlipidemia Mother   . Cancer Mother 71       pancreatic ca  . Diabetes Father   . Hypertension Father   . Stroke Father   . Hearing loss Father   . Heart attack Father   . Kidney disease Father   . Thyroid disease Sister   . Breast cancer Maternal Grandmother   . Cancer Maternal Grandmother   . Ovarian cancer Paternal Grandmother   . Diabetes Paternal Grandmother   . Cancer Paternal Grandmother     Social  History   Socioeconomic History  . Marital status: Married    Spouse name: Not on file  . Number of children: 2  . Years of education: Not on file  . Highest education level: Not on file  Occupational History  . Not on file  Social Needs  . Financial resource strain: Not on file  . Food insecurity:    Worry: Not on file    Inability: Not on file  . Transportation needs:    Medical: Not on file    Non-medical: Not on file  Tobacco Use  . Smoking status: Former Games developer  . Smokeless tobacco: Never Used  Substance and Sexual Activity  . Alcohol use: Yes    Comment: occ  . Drug use: No  . Sexual activity: Not Currently    Partners: Male    Birth control/protection: Post-menopausal  Lifestyle  . Physical activity:    Days per week: Not on file    Minutes per session: Not on file  . Stress: Not on file  Relationships  .  Social connections:    Talks on phone: Not on file    Gets together: Not on file    Attends religious service: Not on file    Active member of club or organization: Not on file    Attends meetings of clubs or organizations: Not on file    Relationship status: Not on file  . Intimate partner violence:    Fear of current or ex partner: Not on file    Emotionally abused: Not on file    Physically abused: Not on file    Forced sexual activity: Not on file  Other Topics Concern  . Not on file  Social History Narrative  . Not on file    Review of Systems  Constitutional: Negative.   HENT: Negative.   Eyes: Negative.   Respiratory: Negative.   Cardiovascular: Negative.   Gastrointestinal: Negative.   Endocrine: Negative.   Genitourinary: Negative.   Musculoskeletal: Negative.   Skin: Negative.   Allergic/Immunologic: Negative.   Neurological: Negative.   Hematological: Negative.   Psychiatric/Behavioral: Negative.   All other systems reviewed and are negative.   PHYSICAL EXAMINATION:    BP 120/72   Pulse 72   LMP 01/03/2009     General appearance: alert, cooperative and appears stated age  Pelvic: External genitalia:  SP incisions intact.              Urethra:  normal appearing urethra with no masses, tenderness or lesions              Bartholins and Skenes: normal                 Vagina: normal appearing vagina with normal color and discharge, no lesions.  Sutures intact and sling protected.              Cervix: no lesions                Bimanual Exam:  Uterus:  normal size, contour, position, consistency, mobility, non-tender              Adnexa: no mass, fullness, tenderness             Chaperone was present for exam.  ASSESSMENT  Status post TV midurethral sling, cystoscopy, and posterior colporrhaphy.  Doing well.  PLAN  Continue decreased activity. Final post op check in 5 weeks.    An After Visit Summary was printed and given  to the patient.

## 2017-09-18 ENCOUNTER — Encounter: Payer: Self-pay | Admitting: Obstetrics and Gynecology

## 2017-09-18 ENCOUNTER — Ambulatory Visit (INDEPENDENT_AMBULATORY_CARE_PROVIDER_SITE_OTHER): Payer: PRIVATE HEALTH INSURANCE | Admitting: Obstetrics and Gynecology

## 2017-09-18 VITALS — BP 120/72 | HR 72

## 2017-09-18 DIAGNOSIS — Z9889 Other specified postprocedural states: Secondary | ICD-10-CM

## 2017-10-20 ENCOUNTER — Ambulatory Visit (INDEPENDENT_AMBULATORY_CARE_PROVIDER_SITE_OTHER): Payer: PRIVATE HEALTH INSURANCE | Admitting: Obstetrics and Gynecology

## 2017-10-20 ENCOUNTER — Encounter: Payer: Self-pay | Admitting: Obstetrics and Gynecology

## 2017-10-20 ENCOUNTER — Other Ambulatory Visit: Payer: Self-pay

## 2017-10-20 VITALS — BP 106/68 | HR 70 | Ht 61.5 in | Wt 161.6 lb

## 2017-10-20 DIAGNOSIS — Z9889 Other specified postprocedural states: Secondary | ICD-10-CM

## 2017-10-20 MED ORDER — NYSTATIN 100000 UNIT/GM EX POWD: Freq: Three times a day (TID) | CUTANEOUS | 2 refills | Status: DC

## 2017-10-20 NOTE — Progress Notes (Signed)
GYNECOLOGY  VISIT   HPI: 57 y.o.   Married  Caucasian  female   [phone] with Patient's last menstrual period was 01/03/2009. here for 10 week follow up TRANSVAGINAL TAPE (TVT) PROCEDURE exact midurethral sling (N/A Vagina ) CYSTOSCOPY (N/A Bladder) POSTERIOR REPAIR (RECTOCELE) (N/A Perineum).  Can feel vaginal burning is she is very active.  The discomfort does go away after a couple of hours.   No vaginal bleeding.   Normal flow of urine and no leakage.   Still wear a pad for confidence.   Happy to be sleeping all night.   Normal bowel function.   Trying to loose weight.  Hx of heat rash under her abdominal skin fold.  Working on weight loss.   Traveling to [facility] for her anniversary at the end of this month.  GYNECOLOGIC HISTORY: Patient's last menstrual period was 01/03/2009. Contraception:  Postmenopausal Menopausal hormone therapy:  none Last mammogram:05/04/2017 BIRAD 2: negative  Last pap smear: 01/19/15 Neg:Neg HR HPV negative        OB History    Gravida  3   Para  2   Term  2   Preterm      AB  1   Living  2     SAB  1   TAB      Ectopic      Multiple      Live Births                 Patient Active Problem List   Diagnosis Date Noted  . Hypercholesterolemia 07/31/2017  . Class 1 obesity with body mass index (BMI) of 30.0 to 30.9 in adult 07/31/2017  . Mixed incontinence 05/02/2017    Past Medical History:  Diagnosis Date  . Allergic rhinitis   . Allergy   . Diverticulosis   . History of cataract removal with insertion of prosthetic lens 2019   Bilateral  . Multiple sclerosis (HCC)   . Urine incontinence   . Vitamin D deficiency     Past Surgical History:  Procedure Laterality Date  . BLADDER SUSPENSION N/A 08/08/2017   Procedure: TRANSVAGINAL TAPE (TVT) PROCEDURE exact midurethral sling;  Surgeon: Patton Salles, MD;  Location: [facility];  Service: Gynecology;  Laterality: N/A;  . BREAST  SURGERY  11/08   Rt.breast Bx--Florid fibrocystic changes--high risk but negative for neoplasm  . CYSTOSCOPY N/A 08/08/2017   Procedure: CYSTOSCOPY;  Surgeon: Patton Salles, MD;  Location: [facility];  Service: Gynecology;  Laterality: N/A;  . ENDOMETRIAL ABLATION  03-07-05   -HTA  . ENDOMETRIAL BIOPSY  12-15-04   --benign endocervical polyp--Dr. Tresa Res  . RECTOCELE REPAIR N/A 08/08/2017   Procedure: POSTERIOR REPAIR (RECTOCELE);  Surgeon: Patton Salles, MD;  Location: [facility];  Service: Gynecology;  Laterality: N/A;  . throat tumor  11/06   --benign  . TUBAL LIGATION      No current outpatient medications on file.   No current facility-administered medications for this visit.      ALLERGIES: Penicillins  Family History  Problem Relation Age of Onset  . Diabetes Mother   . Hypertension Mother   . Hyperlipidemia Mother   . Cancer Mother 68       pancreatic ca  . Diabetes Father   . Hypertension Father   . Stroke Father   . Hearing loss Father   . Heart attack Father   . Kidney  disease Father   . Thyroid disease Sister   . Breast cancer Maternal Grandmother   . Cancer Maternal Grandmother   . Ovarian cancer Paternal Grandmother   . Diabetes Paternal Grandmother   . Cancer Paternal Grandmother     Social History   Socioeconomic History  . Marital status: Married    Spouse name: Not on file  . Number of children: 2  . Years of education: Not on file  . Highest education level: Not on file  Occupational History  . Not on file  Social Needs  . Financial resource strain: Not on file  . Food insecurity:    Worry: Not on file    Inability: Not on file  . Transportation needs:    Medical: Not on file    Non-medical: Not on file  Tobacco Use  . Smoking status: Former Games developer  . Smokeless tobacco: Never Used  Substance and Sexual Activity  . Alcohol use: Yes    Comment: occ  . Drug use: No  . Sexual activity:  Not Currently    Partners: Male    Birth control/protection: Post-menopausal  Lifestyle  . Physical activity:    Days per week: Not on file    Minutes per session: Not on file  . Stress: Not on file  Relationships  . Social connections:    Talks on phone: Not on file    Gets together: Not on file    Attends religious service: Not on file    Active member of club or organization: Not on file    Attends meetings of clubs or organizations: Not on file    Relationship status: Not on file  . Intimate partner violence:    Fear of current or ex partner: Not on file    Emotionally abused: Not on file    Physically abused: Not on file    Forced sexual activity: Not on file  Other Topics Concern  . Not on file  Social History Narrative  . Not on file    Review of Systems  All other systems reviewed and are negative.   PHYSICAL EXAMINATION:    BP 106/68 (BP Location: Right Arm, Patient Position: Sitting, Cuff Size: Large)   Pulse 70   Ht 5' 1.5" (1.562 m)   Wt 161 lb 9.6 oz (73.3 kg)   LMP 01/03/2009   BMI 30.04 kg/m     General appearance: alert, cooperative and appears stated age   Pelvic: External genitalia:  no lesions.  SP incisions intact.              Urethra:  normal appearing urethra with no masses, tenderness or lesions              Bartholins and Skenes: normal                 Vagina: normal appearing vagina with normal color and discharge, no lesions              Cervix: no lesions                Bimanual Exam:  Uterus:  normal size, contour, position, consistency, mobility, non-tender              Adnexa: no mass, fullness, tenderness            Chaperone was present for exam.  ASSESSMENT  Status post TV midurethral sling, cystoscopy, and posterior colporrhaphy.  Doing well post op. Hx heat rash.  PLAN  OK to resume all normal activities in about 2 weeks.  We discussed limiting heavy lifting in the future.  Nystatin powder.  FU for annual exam in  May 2020.    An After Visit Summary was printed and given to the patient.

## 2018-05-04 ENCOUNTER — Ambulatory Visit: Payer: PRIVATE HEALTH INSURANCE | Admitting: Obstetrics and Gynecology

## 2018-06-19 ENCOUNTER — Encounter: Payer: Self-pay | Admitting: Internal Medicine

## 2018-08-10 ENCOUNTER — Ambulatory Visit: Payer: PRIVATE HEALTH INSURANCE | Admitting: Obstetrics and Gynecology

## 2019-04-03 ENCOUNTER — Ambulatory Visit: Payer: PRIVATE HEALTH INSURANCE | Admitting: Obstetrics and Gynecology

## 2019-04-30 NOTE — Progress Notes (Signed)
59 y.o. G90P2012 Married Caucasian female here for annual exam.    Feels some pain with intercourse.  There is something at the entrance which is painful. They are able to have penetration.  They use lubricant.  Husband can feel something.  No vaginal bleeding with intercourse.  No problems completing BMs or having fecal soiling.   No urinary leakage with a cough, laugh or sneeze.  She uses a pad for reassurance.  Voiding well.   Father passed away in Feb 09, 2022 following a urinary tract infection.   She has not done Covid vaccine.   She is retiring next year.   PCP:   Gabrielle Swazil[place], MD  Patient's last menstrual period was 01/03/2009.           Sexually active: Yes.    The current method of family planning is tubal ligation.    Exercising: Yes.    yoga, walking Smoker:  Former -- quit 2007  Health Maintenance: Pap: 01-19-15 Neg:Neg HR HPV, 10-12-12 Neg:Neg HR HPV, 05-13-09 Neg History of abnormal Pap:  Yes, Hx cryotherapy to cervix in her 20's--paps normal since [facility]G:  05-04-17 3D/Neg/density B/BiRads2, scheduled for 06-03-19  Colonoscopy:  06-05-08 moderate diverticulosis;next due 06/2018 with Dr. Marina Goodell BMD:   never Result  n/a TDaP:  2014 Gardasil:   no HIV: Neg years ago Hep C: 03-21-16 Neg Screening Labs:  Hb today: discuss with provider, Urine today: not collected    reports that she has quit smoking. She has never used smokeless tobacco. She reports current alcohol use. She reports that she does not use drugs.  Past Medical History:  Diagnosis Date  . Allergic rhinitis   . Allergy   . Diverticulosis   . History of cataract removal with insertion of prosthetic lens 2019   Bilateral  . Multiple sclerosis (HCC)   . Urine incontinence   . Vitamin D deficiency     Past Surgical History:  Procedure Laterality Date  . BLADDER SUSPENSION N/A 08/08/2017   Procedure: TRANSVAGINAL TAPE (TVT) PROCEDURE exact midurethral sling;  Surgeon: Patton Salles, MD;  Location:  [facility];  Service: Gynecology;  Laterality: N/A;  . BREAST SURGERY  11/08   Rt.breast Bx--Florid fibrocystic changes--high risk but negative for neoplasm  . CYSTOSCOPY N/A 08/08/2017   Procedure: CYSTOSCOPY;  Surgeon: Patton Salles, MD;  Location: [facility];  Service: Gynecology;  Laterality: N/A;  . ENDOMETRIAL ABLATION  03-07-05   -HTA  . ENDOMETRIAL BIOPSY  12-15-04   --benign endocervical polyp--Dr. Tresa Res  . RECTOCELE REPAIR N/A 08/08/2017   Procedure: POSTERIOR REPAIR (RECTOCELE);  Surgeon: Patton Salles, MD;  Location: [facility];  Service: Gynecology;  Laterality: N/A;  . throat tumor  11/06   --benign  . TUBAL LIGATION      Current Outpatient Medications  Medication Sig Dispense Refill  . Fexofenadine-Pseudoephedrine (ALLEGRA-D PO) Take by mouth.     No current facility-administered medications for this visit.    Family History  Problem Relation Age of Onset  . Diabetes Mother   . Hypertension Mother   . Hyperlipidemia Mother   . Cancer Mother 19       pancreatic ca  . Diabetes Father   . Hypertension Father   . Stroke Father   . Hearing loss Father   . Heart attack Father   . Kidney disease Father   . Thyroid disease Sister   . Breast cancer Maternal Grandmother   .  Cancer Maternal Grandmother   . Ovarian cancer Paternal Grandmother   . Diabetes Paternal Grandmother   . Cancer Paternal Grandmother     Review of Systems  Constitutional: Negative.   HENT: Negative.   Eyes: Negative.   Respiratory: Negative.   Cardiovascular: Negative.   Gastrointestinal: Negative.   Endocrine: Negative.   Genitourinary: Positive for dyspareunia.  Musculoskeletal: Negative.   Skin: Negative.   Allergic/Immunologic: Negative.   Neurological: Negative.   Hematological: Negative.   Psychiatric/Behavioral: Negative.     Exam:   BP 104/60 (BP Location: Right Arm, Patient Position: Sitting, Cuff Size:  Large)   Pulse 82   Temp (!) 97.4 F (36.3 C) (Skin)   Resp 14   Ht 5' 2.75" (1.594 m)   Wt 168 lb 6.4 oz (76.4 kg)   LMP 01/03/2009   BMI 30.07 kg/m     General appearance: alert, cooperative and appears stated age Head: normocephalic, without obvious abnormality, atraumatic Neck: no adenopathy, supple, symmetrical, trachea midline and thyroid normal to inspection and palpation Lungs: clear to auscultation bilaterally Breasts: normal appearance, no masses or tenderness, No nipple retraction or dimpling, No nipple discharge or bleeding, No axillary adenopathy Heart: regular rate and rhythm Abdomen: soft, non-tender; no masses, no organomegaly Extremities: extremities normal, atraumatic, no cyanosis or edema Skin: skin color, texture, turgor normal. No rashes or lesions Lymph nodes: cervical, supraclavicular, and axillary nodes normal. Neurologic: grossly normal  Pelvic: External genitalia:  no lesions              No abnormal inguinal nodes palpated.              Urethra:  normal appearing urethra with no masses, tenderness or lesions              Bartholins and Skenes: normal                 Vagina: normal appearing vagina with normal color and discharge, no lesions.  First degree rectocele.               Cervix: no lesions              Pap taken: No. Bimanual Exam:  Uterus:  normal size, contour, position, consistency, mobility, non-tender              Adnexa: no mass, fullness, tenderness              Rectal exam: Yes.  .  Confirms.              Anus:  normal sphincter tone, no lesions  Chaperone was present for exam.  Assessment:   Well woman visit with normal exam. Remote hx of abnormal pap.  Hx of endometrial ablation.  Status post TVT Exact midurethral sling and rectocele repair. recurrent rectocele. FH of breast cancer and ovarian cancer - each on different side of the family.  Plan: Mammogram screening discussed. Self breast awareness reviewed. Pap and HR HPV  as above. Guidelines for Calcium, Vitamin D, regular exercise program including cardiovascular and weight bearing exercise. We discussed risk factors for recurrent rectocele.  She will continue with her vaginal hydration with nonprescription options.   She declines vaginal estrogens.  Follow up annually and prn.   After visit summary provided.

## 2019-05-01 ENCOUNTER — Other Ambulatory Visit: Payer: Self-pay

## 2019-05-01 ENCOUNTER — Ambulatory Visit (INDEPENDENT_AMBULATORY_CARE_PROVIDER_SITE_OTHER): Payer: PRIVATE HEALTH INSURANCE | Admitting: Obstetrics and Gynecology

## 2019-05-01 ENCOUNTER — Encounter: Payer: Self-pay | Admitting: Obstetrics and Gynecology

## 2019-05-01 VITALS — BP 104/60 | HR 82 | Temp 97.4°F | Resp 14 | Ht 62.75 in | Wt 168.4 lb

## 2019-05-01 DIAGNOSIS — Z01419 Encounter for gynecological examination (general) (routine) without abnormal findings: Secondary | ICD-10-CM

## 2019-05-01 NOTE — Patient Instructions (Signed)

## 2019-05-24 ENCOUNTER — Encounter: Payer: Self-pay | Admitting: Obstetrics and Gynecology

## 2019-12-11 ENCOUNTER — Other Ambulatory Visit: Payer: Self-pay

## 2019-12-11 ENCOUNTER — Encounter: Payer: Self-pay | Admitting: Family Medicine

## 2019-12-11 ENCOUNTER — Ambulatory Visit (INDEPENDENT_AMBULATORY_CARE_PROVIDER_SITE_OTHER): Payer: PRIVATE HEALTH INSURANCE | Admitting: Family Medicine

## 2019-12-11 VITALS — BP 122/76 | HR 84 | Temp 97.7°F | Resp 16 | Ht 62.0 in | Wt 163.0 lb

## 2019-12-11 DIAGNOSIS — K5900 Constipation, unspecified: Secondary | ICD-10-CM | POA: Diagnosis not present

## 2019-12-11 DIAGNOSIS — K644 Residual hemorrhoidal skin tags: Secondary | ICD-10-CM

## 2019-12-11 MED ORDER — HYDROCORTISONE (PERIANAL) 2.5 % EX CREA
1.0000 | TOPICAL_CREAM | Freq: Two times a day (BID) | CUTANEOUS | 1 refills | Status: AC
Start: 2019-12-11 — End: 2019-12-25

## 2019-12-11 NOTE — Patient Instructions (Addendum)
A few things to remember from today's visit:   Constipation, unspecified constipation type  External hemorrhoids - Plan: hydrocortisone (PROCTOSOL HC) 2.5 % rectal cream  Miralax daily as needed.  Constipation, Adult Constipation is when a person:  Poops (has a bowel movement) fewer times in a week than normal.  Has a hard time pooping.  Has poop that is dry, hard, or bigger than normal. Follow these instructions at home: Eating and drinking   Eat foods that have a lot of fiber, such as: ? Fresh fruits and vegetables. ? Whole grains. ? Beans.  Eat less of foods that are high in fat, low in fiber, or overly processed, such as: ? [place] fries. ? Hamburgers. ? Cookies. ? Candy. ? Soda.  Drink enough fluid to keep your pee (urine) clear or pale yellow. General instructions  Exercise regularly or as told by your doctor.  Go to the restroom when you feel like you need to poop. Do not hold it in.  Take over-the-counter and prescription medicines only as told by your doctor. These include any fiber supplements.  Do pelvic floor retraining exercises, such as: ? Doing deep breathing while relaxing your lower belly (abdomen). ? Relaxing your pelvic floor while pooping.  Watch your condition for any changes.  Keep all follow-up visits as told by your doctor. This is important. Contact a doctor if:  You have pain that gets worse.  You have a fever.  You have not pooped for 4 days.  You throw up (vomit).  You are not hungry.  You lose weight.  You are bleeding from the anus.  You have thin, pencil-like poop (stool). Get help right away if:  You have a fever, and your symptoms suddenly get worse.  You leak poop or have blood in your poop.  Your belly feels hard or bigger than normal (is bloated).  You have very bad belly pain.  You feel dizzy or you faint. This information is not intended to replace advice given to you by your health care provider. Make  sure you discuss any questions you have with your health care provider. Document Revised: 12/02/2016 Document Reviewed: 06/10/2015 Elsevier Patient Education  2020 [employer].  If you need refills please call your pharmacy. Do not use My Chart to request refills or for acute issues that need immediate attention.    Please be sure medication list is accurate. If a new problem present, please set up appointment sooner than planned today.

## 2019-12-11 NOTE — Progress Notes (Signed)
Chief Complaint  Patient presents with  . Hemorrhoids   HPI: Gabrielle Patterson is a 59 y.o. female with history of allergies, MS, and vitamin D deficiency here today complaining of perianal tenderness, which she attributes to hemorrhoids. She has felt a " puffy" area that is tender with palpation and exacerbated by contact with chair when sitting. No history of trauma. No prior history of hemorrhoids. Negative for dyschezia or hematochezia.  Today is seems to be better. She has tried OTC hemorrhoid creams and suppositories. Sitz bath with apple cider vinegar.  + Constipation. Negative for dietary changes. "Trouble" having bowel movements.  Last bowel movement was this morning. She has not noted fever,abnormal wt loss,abdominal pain, melena, blood in the stool, changes in bowel habits, or urinary symptoms.  She takes Dulcolax every 2 to 3 days as needed.  Colonoscopy on 06/06/19 negative except for moderate diverticulosis throughout the colon.  Review of Systems  Constitutional: Negative for activity change and appetite change.  HENT: Negative for mouth sores and nosebleeds.   Respiratory: Negative for cough, shortness of breath and wheezing.   Cardiovascular: Negative for palpitations and leg swelling.  Endocrine: Negative for cold intolerance and heat intolerance.  Genitourinary: Negative for decreased urine volume, dysuria and hematuria.  Musculoskeletal: Negative for gait problem and myalgias.  Rest see pertinent positives and negatives per HPI.  Current Outpatient Medications on File Prior to Visit  Medication Sig Dispense Refill  . Fexofenadine-Pseudoephedrine (ALLEGRA-D PO) Take by mouth.     No current facility-administered medications on file prior to visit.   Past Medical History:  Diagnosis Date  . Allergic rhinitis   . Allergy   . Diverticulosis   . History of cataract removal with insertion of prosthetic lens 2019   Bilateral  . Multiple sclerosis  (HCC)   . Urine incontinence   . Vitamin D deficiency    Allergies  Allergen Reactions  . Penicillins Hives  . Sulfa Antibiotics Rash    Social History   Socioeconomic History  . Marital status: Married    Spouse name: Not on file  . Number of children: 2  . Years of education: Not on file  . Highest education level: Not on file  Occupational History  . Not on file  Tobacco Use  . Smoking status: Former Games developer  . Smokeless tobacco: Never Used  Vaping Use  . Vaping Use: Never used  Substance and Sexual Activity  . Alcohol use: Yes    Comment: occ  . Drug use: No  . Sexual activity: Not Currently    Partners: Male    Birth control/protection: Post-menopausal  Other Topics Concern  . Not on file  Social History Narrative  . Not on file   Social Determinants of Health   Financial Resource Strain: Not on file  Food Insecurity: Not on file  Transportation Needs: Not on file  Physical Activity: Not on file  Stress: Not on file  Social Connections: Not on file   Vitals:   12/11/19 1358  BP: 122/76  Pulse: 84  Resp: 16  Temp: 97.7 F (36.5 C)  SpO2: 96%   Body mass index is 29.81 kg/m.  Physical Exam Vitals and nursing note reviewed. Exam conducted with a chaperone present.  Constitutional:      General: She is not in acute distress.    Appearance: She is well-developed. She is not ill-appearing.  HENT:     Head: Normocephalic and atraumatic.  Eyes:  Conjunctiva/sclera: Conjunctivae normal.  Pulmonary:     Effort: Pulmonary effort is normal. No respiratory distress.     Breath sounds: Normal breath sounds.  Abdominal:     Palpations: Abdomen is soft. There is no hepatomegaly or mass.     Tenderness: There is no abdominal tenderness.  Genitourinary:    Rectum: No mass, tenderness or anal fissure. Normal anal tone.     Comments: Around 10 O'clock skin tag and hemorrhoid with small thrombus (slightly purplish),not tender.No erythema. Musculoskeletal:      Right lower leg: No edema.     Left lower leg: No edema.  Skin:    General: Skin is warm.     Findings: No erythema or rash.  Neurological:     Mental Status: She is alert and oriented to person, place, and time.    ASSESSMENT AND PLAN:  Gabrielle Patterson was seen today for hemorrhoids.  Diagnoses and all orders for this visit:  Constipation, unspecified constipation type Adequate hydration and fiber intake. OTC Miralax daily as needed. She can add Bisacodyl 5 mg every 2 days as needed. If problem is persistent Linzess can be considered.  External hemorrhoids Avoid constipation,straining, and prolonged toilet sitting. Topical steroids for up to 14 days. Sitz bath daily. Monitor for worsening pain or increasing hemorrhoid size.  -     hydrocortisone (PROCTOSOL HC) 2.5 % rectal cream; Place 1 application rectally 2 (two) times daily for 14 days.   Return if symptoms worsen or fail to improve.   Faun Mcqueen G. Swazil[place], MD  [facility]. [place] office.   A few things to remember from today's visit:   Constipation, unspecified constipation type  External hemorrhoids - Plan: hydrocortisone (PROCTOSOL HC) 2.5 % rectal cream  Miralax daily as needed.  Constipation, Adult Constipation is when a person:  Poops (has a bowel movement) fewer times in a week than normal.  Has a hard time pooping.  Has poop that is dry, hard, or bigger than normal. Follow these instructions at home: Eating and drinking   Eat foods that have a lot of fiber, such as: ? Fresh fruits and vegetables. ? Whole grains. ? Beans.  Eat less of foods that are high in fat, low in fiber, or overly processed, such as: ? [place] fries. ? Hamburgers. ? Cookies. ? Candy. ? Soda.  Drink enough fluid to keep your pee (urine) clear or pale yellow. General instructions  Exercise regularly or as told by your doctor.  Go to the restroom when you feel like you need to poop. Do not hold it  in.  Take over-the-counter and prescription medicines only as told by your doctor. These include any fiber supplements.  Do pelvic floor retraining exercises, such as: ? Doing deep breathing while relaxing your lower belly (abdomen). ? Relaxing your pelvic floor while pooping.  Watch your condition for any changes.  Keep all follow-up visits as told by your doctor. This is important. Contact a doctor if:  You have pain that gets worse.  You have a fever.  You have not pooped for 4 days.  You throw up (vomit).  You are not hungry.  You lose weight.  You are bleeding from the anus.  You have thin, pencil-like poop (stool). Get help right away if:  You have a fever, and your symptoms suddenly get worse.  You leak poop or have blood in your poop.  Your belly feels hard or bigger than normal (is bloated).  You have very bad belly  pain.  You feel dizzy or you faint. This information is not intended to replace advice given to you by your health care provider. Make sure you discuss any questions you have with your health care provider. Document Revised: 12/02/2016 Document Reviewed: 06/10/2015 Elsevier Patient Education  2020 [employer].  If you need refills please call your pharmacy. Do not use My Chart to request refills or for acute issues that need immediate attention.    Please be sure medication list is accurate. If a new problem present, please set up appointment sooner than planned today.

## 2020-04-30 NOTE — Progress Notes (Deleted)
60 y.o. G61P2012 Married Caucasian female here for annual exam.    PCP:     Patient's last menstrual period was 01/03/2009.           Sexually active: {yes no:314532}  The current method of family planning is tubal ligation.    Exercising: {yes BS:962836}  {types:19826} Smoker:  Former--quit 2007  Health Maintenance: Pap: 01-19-15 Neg:Neg HR HPV, 10-12-12 Neg:Neg HR HPV, 05-13-09 Neg History of abnormal Pap:  Yes, Hx cryotherapy to cervix in her 20's--paps normal since MM[facility]:  ***05-04-17 3D/Neg/density B/BiRads2 Colonoscopy:  *** 06-05-08 moderate diverticulosis;next due 06/2018 with Dr. Marina Goodell BMD: ***  Result  *** TDaP:  2014 Gardasil:   no HIV: Neg years ago Hep C:03-21-16 Neg Screening Labs:  Hb today: ***, Urine today: ***   reports that she has quit smoking. She has never used smokeless tobacco. She reports current alcohol use. She reports that she does not use drugs.  Past Medical History:  Diagnosis Date  . Allergic rhinitis   . Allergy   . Diverticulosis   . History of cataract removal with insertion of prosthetic lens 2019   Bilateral  . Multiple sclerosis (HCC)   . Urine incontinence   . Vitamin D deficiency     Past Surgical History:  Procedure Laterality Date  . BLADDER SUSPENSION N/A 08/08/2017   Procedure: TRANSVAGINAL TAPE (TVT) PROCEDURE exact midurethral sling;  Surgeon: Patton Salles, MD;  Location: [facility];  Service: Gynecology;  Laterality: N/A;  . BREAST SURGERY  11/08   Rt.breast Bx--Florid fibrocystic changes--high risk but negative for neoplasm  . CYSTOSCOPY N/A 08/08/2017   Procedure: CYSTOSCOPY;  Surgeon: Patton Salles, MD;  Location: [facility];  Service: Gynecology;  Laterality: N/A;  . ENDOMETRIAL ABLATION  03-07-05   -HTA  . ENDOMETRIAL BIOPSY  12-15-04   --benign endocervical polyp--Dr. Tresa Res  . RECTOCELE REPAIR N/A 08/08/2017   Procedure: POSTERIOR REPAIR (RECTOCELE);  Surgeon: Patton Salles, MD;  Location: [facility];  Service: Gynecology;  Laterality: N/A;  . throat tumor  11/06   --benign  . TUBAL LIGATION      Current Outpatient Medications  Medication Sig Dispense Refill  . Fexofenadine-Pseudoephedrine (ALLEGRA-D PO) Take by mouth.     No current facility-administered medications for this visit.    Family History  Problem Relation Age of Onset  . Diabetes Mother   . Hypertension Mother   . Hyperlipidemia Mother   . Cancer Mother 70       pancreatic ca  . Diabetes Father   . Hypertension Father   . Stroke Father   . Hearing loss Father   . Heart attack Father   . Kidney disease Father   . Thyroid disease Sister   . Breast cancer Maternal Grandmother   . Cancer Maternal Grandmother   . Ovarian cancer Paternal Grandmother   . Diabetes Paternal Grandmother   . Cancer Paternal Grandmother     Review of Systems  Exam:   LMP 01/03/2009     General appearance: alert, cooperative and appears stated age Head: normocephalic, without obvious abnormality, atraumatic Neck: no adenopathy, supple, symmetrical, trachea midline and thyroid normal to inspection and palpation Lungs: clear to auscultation bilaterally Breasts: normal appearance, no masses or tenderness, No nipple retraction or dimpling, No nipple discharge or bleeding, No axillary adenopathy Heart: regular rate and rhythm Abdomen: soft, non-tender; no masses, no organomegaly Extremities: extremities normal, atraumatic, no  cyanosis or edema Skin: skin color, texture, turgor normal. No rashes or lesions Lymph nodes: cervical, supraclavicular, and axillary nodes normal. Neurologic: grossly normal  Pelvic: External genitalia:  no lesions              No abnormal inguinal nodes palpated.              Urethra:  normal appearing urethra with no masses, tenderness or lesions              Bartholins and Skenes: normal                 Vagina: normal appearing vagina with normal  color and discharge, no lesions              Cervix: no lesions              Pap taken: {yes no:314532} Bimanual Exam:  Uterus:  normal size, contour, position, consistency, mobility, non-tender              Adnexa: no mass, fullness, tenderness              Rectal exam: {yes no:314532}.  Confirms.              Anus:  normal sphincter tone, no lesions  Chaperone was present for exam.  Assessment:   Well woman visit with normal exam.   Plan: Mammogram screening discussed. Self breast awareness reviewed. Pap and HR HPV as above. Guidelines for Calcium, Vitamin D, regular exercise program including cardiovascular and weight bearing exercise.   Follow up annually and prn.   Additional counseling given.  {yes [id number]. _______ minutes face to face time of which over 50% was spent in counseling.    After visit summary provided.

## 2020-05-04 ENCOUNTER — Ambulatory Visit: Payer: PRIVATE HEALTH INSURANCE | Admitting: Obstetrics and Gynecology

## 2020-06-23 LAB — HM MAMMOGRAPHY

## 2020-06-24 ENCOUNTER — Encounter: Payer: Self-pay | Admitting: Family Medicine

## 2020-08-10 NOTE — Progress Notes (Deleted)
60 y.o. G12P2012 Married Caucasian female here for annual exam.    PCP:     Patient's last menstrual period was 01/03/2009.           Sexually active: {yes no:314532}  The current method of family planning is tubal ligation.    Exercising: {yes [id number]}  {types:19826} Smoker:  Former--quit 2007  Health Maintenance: Pap: 01-19-15 Neg:Neg HR HPV, 10-12-12 Neg:Neg HR HPV, 05-13-09 Neg History of abnormal Pap:  yes,  Hx cryotherapy to cervix in her 20's--paps normal since [facility]: 06-23-20 3D/Neg/BiRads1 Colonoscopy: *** 06-05-08 moderate diverticulosis;next due 06/2018 BMD:   ***  Result  *** TDaP: 2014 Gardasil:   no HIV: Neg years ago Hep C: 03-21-16 Neg Screening Labs:  Hb today: ***, Urine today: ***   reports that she has quit smoking. She has never used smokeless tobacco. She reports current alcohol use. She reports that she does not use drugs.  Past Medical History:  Diagnosis Date   Allergic rhinitis    Allergy    Diverticulosis    History of cataract removal with insertion of prosthetic lens 2019   Bilateral   Multiple sclerosis (HCC)    Urine incontinence    Vitamin D deficiency     Past Surgical History:  Procedure Laterality Date   BLADDER SUSPENSION N/A 08/08/2017   Procedure: TRANSVAGINAL TAPE (TVT) PROCEDURE exact midurethral sling;  Surgeon: Patton Salles, MD;  Location: [facility];  Service: Gynecology;  Laterality: N/A;   BREAST SURGERY  11/08   Rt.breast Bx--Florid fibrocystic changes--high risk but negative for neoplasm   CYSTOSCOPY N/A 08/08/2017   Procedure: CYSTOSCOPY;  Surgeon: Patton Salles, MD;  Location: [facility];  Service: Gynecology;  Laterality: N/A;   ENDOMETRIAL ABLATION  03-07-05   -HTA   ENDOMETRIAL BIOPSY  12-15-04   --benign endocervical polyp--Dr. 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     No current facility-administered medications for this visit.    Family History  Problem Relation Age of Onset   Diabetes Mother    Hypertension Mother    Hyperlipidemia Mother    Cancer Mother 64       pancreatic ca   Diabetes Father    Hypertension Father    Stroke Father    Hearing loss Father    Heart attack Father    Kidney disease Father    Thyroid disease Sister    Breast cancer Maternal Grandmother    Cancer Maternal Grandmother    Ovarian cancer Paternal Grandmother    Diabetes Paternal Grandmother    Cancer Paternal Grandmother     Review of Systems  Exam:   LMP 01/03/2009     General appearance: alert, cooperative and appears stated age Head: normocephalic, without obvious abnormality, atraumatic Neck: no adenopathy, supple, symmetrical, trachea midline and thyroid normal to inspection and palpation Lungs: clear to auscultation bilaterally Breasts: normal appearance, no masses or tenderness, No nipple retraction or dimpling, No nipple discharge or bleeding, No axillary adenopathy Heart: regular rate and rhythm Abdomen: soft, non-tender; no masses, no organomegaly Extremities: extremities normal, atraumatic, no cyanosis or edema  Skin: skin color, texture, turgor normal. No rashes or lesions Lymph nodes: cervical, supraclavicular, and axillary nodes normal. Neurologic: grossly normal  Pelvic: External genitalia:  no lesions              No abnormal inguinal nodes palpated.              Urethra:  normal appearing urethra with no masses, tenderness or lesions              Bartholins and Skenes: normal                 Vagina: normal appearing vagina with normal color and discharge, no lesions              Cervix: no  lesions              Pap taken: {yes no:314532} Bimanual Exam:  Uterus:  normal size, contour, position, consistency, mobility, non-tender              Adnexa: no mass, fullness, tenderness              Rectal exam: {yes no:314532}.  Confirms.              Anus:  normal sphincter tone, no lesions  Chaperone was present for exam:  ***  Assessment:   Well woman visit with gynecologic exam.   Plan: Mammogram screening discussed. Self breast awareness reviewed. Pap and HR HPV as above. Guidelines for Calcium, Vitamin D, regular exercise program including cardiovascular and weight bearing exercise.   Follow up annually and prn.   Additional counseling given.  {yes [id number]. _______ minutes face to face time of which over 50% was spent in counseling.    After visit summary provided.

## 2020-08-13 ENCOUNTER — Ambulatory Visit: Payer: PRIVATE HEALTH INSURANCE | Admitting: Obstetrics and Gynecology

## 2020-10-21 NOTE — Progress Notes (Signed)
60 y.o. G84P2012 Married Caucasian female here for annual exam.    Trying vitamin E for vaginal moisture.  It is working well.  Wants to remain hormone free.  Can control her bladder better now.  Bowel function is good.   Partially retired and enjoying life.  PCP:   Betty [place], MD  Patient's last menstrual period was 01/03/2009.           Sexually active: Yes.    The current method of family planning is tubal ligation.    Exercising: Yes.     Yoga, kayaking, walking, golf Smoker:  no  Health Maintenance: Pap:  01-19-15 neg HPV HR neg, 10-12-12 neg HPV HR neg History of abnormal Pap:  yes, hx of cryotherapy to cervix in her 20's, paps normal since MMG:  06-23-20 category a density birads 1:neg Colonoscopy: 06-05-08  BMD:   none  Result  n/a TDaP:  2014 Gardasil:   no HIV: neg yrs ago Hep C: neg 2018 Screening Labs: today   reports that she has quit smoking. She has never used smokeless tobacco. She reports current alcohol use. She reports that she does not use drugs.  Past Medical History:  Diagnosis Date   Allergic rhinitis    Allergy    COVID    2021, 2022   Diverticulosis    History of cataract removal with insertion of prosthetic lens 2019   Bilateral   Multiple sclerosis (HCC)    Urine incontinence    Vitamin D deficiency     Past Surgical History:  Procedure Laterality Date   BLADDER SUSPENSION N/A 08/08/2017   Procedure: TRANSVAGINAL TAPE (TVT) PROCEDURE exact midurethral sling;  Surgeon: Patton Salles, MD;  Location: [facility];  Service: Gynecology;  Laterality: N/A;   BREAST SURGERY  11/08   Rt.breast Bx--Florid fibrocystic changes--high risk but negative for neoplasm   CYSTOSCOPY N/A 08/08/2017   Procedure: CYSTOSCOPY;  Surgeon: Patton Salles, MD;  Location: [facility];  Service: Gynecology;  Laterality: N/A;   ENDOMETRIAL ABLATION  03-07-05   -HTA   ENDOMETRIAL BIOPSY  12-15-04   --benign  endocervical polyp--Dr. 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     No current facility-administered medications for this visit.    Family History  Problem Relation Age of Onset   Diabetes Mother    Hypertension Mother    Hyperlipidemia Mother    Cancer Mother 61       pancreatic ca   Diabetes Father    Hypertension Father    Stroke Father    Hearing loss Father    Heart attack Father    Kidney disease Father    Thyroid disease Sister    Breast cancer Maternal Grandmother    Cancer Maternal Grandmother    Ovarian cancer Paternal Grandmother    Diabetes Paternal Grandmother    Cancer Paternal Grandmother     Review of Systems  Constitutional: Negative.   HENT: Negative.    Eyes: Negative.   Respiratory: Negative.    Cardiovascular: Negative.   Gastrointestinal: Negative.   Endocrine: Negative.   Genitourinary: Negative.  Musculoskeletal: Negative.   Skin: Negative.   Allergic/Immunologic: Negative.   Neurological: Negative.   Hematological: Negative.   Psychiatric/Behavioral: Negative.     Exam:   BP 110/80   Pulse 73   Resp 16   Ht 5' 0.75" (1.543 m)   Wt 160 lb (72.6 kg)   LMP 01/03/2009   BMI 30.48 kg/m     General appearance: alert, cooperative and appears stated age Head: normocephalic, without obvious abnormality, atraumatic Neck: no adenopathy, supple, symmetrical, trachea midline and thyroid normal to inspection and palpation Lungs: clear to auscultation bilaterally Breasts: normal appearance, no masses or tenderness, No nipple retraction or dimpling, No nipple discharge or bleeding, No axillary  adenopathy Heart: regular rate and rhythm Abdomen: soft, non-tender; no masses, no organomegaly Extremities: extremities normal, atraumatic, no cyanosis or edema Skin: skin color, texture, turgor normal. No rashes or lesions Lymph nodes: cervical, supraclavicular, and axillary nodes normal. Neurologic: grossly normal  Pelvic: External genitalia:  no lesions              No abnormal inguinal nodes palpated.              Urethra:  normal appearing urethra with no masses, tenderness or lesions              Bartholins and Skenes: normal                 Vagina: normal appearing vagina with normal color and discharge, no lesions              Cervix: no lesions              Pap taken: yes Bimanual Exam:  Uterus:  normal size, contour, position, consistency, mobility, non-tender              Adnexa: no mass, fullness, tenderness              Rectal exam: yes.  Confirms.              Anus:  normal sphincter tone, no lesions  Chaperone was present for exam:  Joy, CMA  Assessment:   Well woman visit with gynecologic exam. Remote hx of abnormal pap.  Hx of endometrial ablation.  Status post TVT Exact midurethral sling and rectocele repair. FH of breast cancer and ovarian cancer - each on different side of the family.   Plan: Mammogram screening discussed. Self breast awareness reviewed. Pap and HR HPV as above. Guidelines for Calcium, Vitamin D, regular exercise program including cardiovascular and weight bearing exercise. Routine labs.   Follow up annually and prn.   After visit summary provided.

## 2020-10-23 ENCOUNTER — Ambulatory Visit (INDEPENDENT_AMBULATORY_CARE_PROVIDER_SITE_OTHER): Payer: No Typology Code available for payment source | Admitting: Obstetrics and Gynecology

## 2020-10-23 ENCOUNTER — Other Ambulatory Visit (HOSPITAL_COMMUNITY)
Admission: RE | Admit: 2020-10-23 | Discharge: 2020-10-23 | Disposition: A | Discharge status: Home / Self Care | Payer: No Typology Code available for payment source | Source: Ambulatory Visit | Attending: Obstetrics and Gynecology | Admitting: Obstetrics and Gynecology

## 2020-10-23 ENCOUNTER — Encounter: Payer: Self-pay | Admitting: Obstetrics and Gynecology

## 2020-10-23 ENCOUNTER — Other Ambulatory Visit: Payer: Self-pay

## 2020-10-23 VITALS — BP 110/80 | HR 73 | Resp 16 | Ht 60.75 in | Wt 160.0 lb

## 2020-10-23 DIAGNOSIS — Z01419 Encounter for gynecological examination (general) (routine) without abnormal findings: Secondary | ICD-10-CM

## 2020-10-23 DIAGNOSIS — Z124 Encounter for screening for malignant neoplasm of cervix: Secondary | ICD-10-CM | POA: Diagnosis not present

## 2020-10-23 NOTE — Patient Instructions (Signed)

## 2020-10-24 LAB — TSH: TSH: 1.05 mIU/L (ref 0.40–4.50)

## 2020-10-24 LAB — LIPID PANEL
Cholesterol: 216 mg/dL — ABNORMAL HIGH (ref <200)
HDL: 59 mg/dL (ref >=50)
LDL Cholesterol (Calc): 134 mg/dL (calc) — ABNORMAL HIGH
Non-HDL Cholesterol (Calc): 157 mg/dL (calc) — ABNORMAL HIGH (ref <130)
Total CHOL/HDL Ratio: 3.7 (calc) (ref <5.0)
Triglycerides: 118 mg/dL (ref <150)

## 2020-10-24 LAB — CBC
HCT: 44.5 % (ref 35.0–45.0)
Hemoglobin: 14.6 g/dL (ref 11.7–15.5)
MCH: 29 pg (ref 27.0–33.0)
MCHC: 32.8 g/dL (ref 32.0–36.0)
MCV: 88.5 fL (ref 80.0–100.0)
MPV: 12 fL (ref 7.5–12.5)
Platelets: 286 10*3/uL (ref 140–400)
RBC: 5.03 10*6/uL (ref 3.80–5.10)
RDW: 12.2 % (ref 11.0–15.0)
WBC: 8.2 10*3/uL (ref 3.8–10.8)

## 2020-10-24 LAB — COMPREHENSIVE METABOLIC PANEL
AG Ratio: 1.7 (calc) (ref 1.0–2.5)
ALT: 18 U/L (ref 6–29)
AST: 16 U/L (ref 10–35)
Albumin: 4.3 g/dL (ref 3.6–5.1)
Alkaline phosphatase (APISO): 81 U/L (ref 37–153)
BUN: 14 mg/dL (ref 7–25)
CO2: 27 mmol/L (ref 20–32)
Calcium: 9.5 mg/dL (ref 8.6–10.4)
Chloride: 104 mmol/L (ref 98–110)
Creat: 0.74 mg/dL (ref 0.50–1.05)
Globulin: 2.6 g/dL (calc) (ref 1.9–3.7)
Glucose, Bld: 87 mg/dL (ref 65–99)
Potassium: 3.9 mmol/L (ref 3.5–5.3)
Sodium: 139 mmol/L (ref 135–146)
Total Bilirubin: 0.4 mg/dL (ref 0.2–1.2)
Total Protein: 6.9 g/dL (ref 6.1–8.1)

## 2020-10-24 LAB — VITAMIN D 25 HYDROXY (VIT D DEFICIENCY, FRACTURES): Vit D, 25-Hydroxy: 28 ng/mL — ABNORMAL LOW (ref 30–100)

## 2020-10-26 LAB — CYTOLOGY - PAP
Comment: NEGATIVE
Diagnosis: NEGATIVE
High risk HPV: NEGATIVE

## 2021-01-15 NOTE — Progress Notes (Signed)
ACUTE VISIT Chief Complaint  Patient presents with   digestion problems   HPI: [name] is a 61 y.o. female, who is here today complaining of GI symptoms last week, she thinks she may have had a GI viral infection. Negative for sick contact or recent travel.  She was last seen on 12/11/19 for constipation. Periumbilical abdominal cramps about 10 days ago, exacerbated by food intake, alleviated by defecation. She did change her diet for a few days, bland food until abdominal pain resolved. She had some constipation, small and dark brown. Took OTC tea and it seemed to help. Constipation has improved, having daily bowel movements.  She is concerned because for the past 7-10 days her stools float. She has not noted fat in stool.  Negative for fever,chills, fatigue, nausea, vomiting, changes in bowel habits, blood in stool or melena.  She is overdue for colonoscopy, she would like to go ahead and schedule appt.  Since her last visit she has seen her gyn and had blood work done. HLD on non pharmacologic treatment. She is exercising regularly. Has increased vegetable and fruit intake. Eating more fish. Wt has been stable.  Lab Results  Component Value Date   CHOL 216 (H) 10/23/2020   HDL 59 10/23/2020   LDLCALC 134 (H) 10/23/2020   TRIG 118 10/23/2020   CHOLHDL 3.7 10/23/2020   Review of Systems  Constitutional:  Negative for activity change and appetite change.  HENT:  Negative for mouth sores, nosebleeds, sore throat and trouble swallowing.   Eyes:  Negative for redness and visual disturbance.  Respiratory:  Negative for cough, shortness of breath and wheezing.   Cardiovascular:  Negative for chest pain, palpitations and leg swelling.  Genitourinary:  Negative for decreased urine volume, dysuria and hematuria.  Musculoskeletal:  Negative for gait problem and myalgias.  Skin:  Negative for rash.  Neurological:  Negative for syncope, weakness and headaches.   Hematological:  Negative for adenopathy. Does not bruise/bleed easily.  Rest see pertinent positives and negatives per HPI.  No current outpatient medications on file prior to visit.   No current facility-administered medications on file prior to visit.   Past Medical History:  Diagnosis Date   Allergic rhinitis    Allergy    COVID    2021, 2022   Diverticulosis    History of cataract removal with insertion of prosthetic lens 2019   Bilateral   Multiple sclerosis (HCC)    Urine incontinence    Vitamin D deficiency    Allergies  Allergen Reactions   Penicillins Hives   Sulfa Antibiotics Rash    Social History   Socioeconomic History   Marital status: Married    Spouse name: Not on file   Number of children: 2   Years of education: Not on file   Highest education level: Not on file  Occupational History   Not on file  Tobacco Use   Smoking status: Former   Smokeless tobacco: Never  Vaping Use   Vaping Use: Never used  Substance and Sexual Activity   Alcohol use: Yes    Comment: occ   Drug use: No   Sexual activity: Yes    Partners: Male    Birth control/protection: Post-menopausal  Other Topics Concern   Not on file  Social History Narrative   Not on file   Social Determinants of Health   Financial Resource Strain: Not on file  Food Insecurity: Not on file  Transportation Needs: Not on  file  Physical Activity: Not on file  Stress: Not on file  Social Connections: Not on file    Vitals:   01/18/21 1114  BP: 126/80  Pulse: 90  Resp: 16  SpO2: 97%   Wt Readings from Last 3 Encounters:  01/18/21 163 lb 2 oz (74 kg)  10/23/20 160 lb (72.6 kg)  12/11/19 163 lb (73.9 kg)   Body mass index is 31.08 kg/m.  Physical Exam Vitals and nursing note reviewed.  Constitutional:      General: She is not in acute distress.    Appearance: She is well-developed.  HENT:     Head: Normocephalic and atraumatic.     Mouth/Throat:     Mouth: Mucous  membranes are moist.     Pharynx: Oropharynx is clear.  Eyes:     Conjunctiva/sclera: Conjunctivae normal.  Cardiovascular:     Rate and Rhythm: Normal rate and regular rhythm.     Pulses:          Dorsalis pedis pulses are 2+ on the right side and 2+ on the left side.     Heart sounds: No murmur heard. Pulmonary:     Effort: Pulmonary effort is normal. No respiratory distress.     Breath sounds: Normal breath sounds.  Abdominal:     Palpations: Abdomen is soft. There is no hepatomegaly or mass.     Tenderness: There is no abdominal tenderness.  Lymphadenopathy:     Cervical: No cervical adenopathy.  Skin:    General: Skin is warm.     Findings: No erythema or rash.  Neurological:     General: No focal deficit present.     Mental Status: She is alert and oriented to person, place, and time.     Cranial Nerves: No cranial nerve deficit.     Gait: Gait normal.  Psychiatric:     Comments: Well groomed, good eye contact.   ASSESSMENT AND PLAN:  Ms. Gabrielle Patterson was seen today for digestion problems.  Diagnoses and all orders for this visit:  Hyperlipidemia Continue non pharmacologic treatment.  The 10-year ASCVD risk score (Arnett DK, et al., 2019) is: 4%   Values used to calculate the score:     Age: 83 years     Sex: Female     Is Non-Hispanic African American: No     Diabetic: No     Tobacco smoker: No     Systolic Blood Pressure: 123XX123 mmHg     Is BP treated: No     HDL Cholesterol: 59 mg/dL     Total Cholesterol: 216 mg/dL  Her gyn orders labs annually.  Class 1 obesity with body mass index (BMI) of 30.0 to 30.9 in adult Wt has been stable. She understands the benefits of wt loss. Consistency with healthy diet and physical activity encouraged.  Constipation She has had problem intermittent for a while, more recent episode has improved. Adequate fiber intake,benefiber 1 tsp bid and adequate fiber intake recommended. TSH 1.05 in 10/2020.  Colon cancer screening -      Ambulatory referral to Gastroenterology  Return if symptoms worsen or fail to improve.  Deanna Boehlke G. Martin[place], MD  [facility]. [place] office.

## 2021-01-18 ENCOUNTER — Encounter: Payer: Self-pay | Admitting: Family Medicine

## 2021-01-18 ENCOUNTER — Ambulatory Visit (INDEPENDENT_AMBULATORY_CARE_PROVIDER_SITE_OTHER): Payer: No Typology Code available for payment source | Admitting: Family Medicine

## 2021-01-18 VITALS — BP 126/80 | HR 90 | Resp 16 | Ht 60.75 in | Wt 163.1 lb

## 2021-01-18 DIAGNOSIS — Z6830 Body mass index (BMI) 30.0-30.9, adult: Secondary | ICD-10-CM

## 2021-01-18 DIAGNOSIS — K5900 Constipation, unspecified: Secondary | ICD-10-CM | POA: Diagnosis not present

## 2021-01-18 DIAGNOSIS — E785 Hyperlipidemia, unspecified: Secondary | ICD-10-CM

## 2021-01-18 DIAGNOSIS — E6609 Other obesity due to excess calories: Secondary | ICD-10-CM | POA: Diagnosis not present

## 2021-01-18 DIAGNOSIS — Z1211 Encounter for screening for malignant neoplasm of colon: Secondary | ICD-10-CM | POA: Diagnosis not present

## 2021-01-18 NOTE — Assessment & Plan Note (Signed)
She has had problem intermittent for a while, more recent episode has improved. Adequate fiber intake,benefiber 1 tsp bid and adequate fiber intake recommended. TSH 1.05 in 10/2020.

## 2021-01-18 NOTE — Patient Instructions (Addendum)
A few things to remember from today's visit:  Colon cancer screening - Plan: Ambulatory referral to Gastroenterology  Constipation, unspecified constipation type  If you need refills please call your pharmacy. Do not use My Chart to request refills or for acute issues that need immediate attention.   Benefiber 1 tsp 2 times daily will help with bowel movements. Adequate fiber intake. Continue low fat diet.  Please be sure medication list is accurate. If a new problem present, please set up appointment sooner than planned today.

## 2021-01-18 NOTE — Assessment & Plan Note (Addendum)
Continue non pharmacologic treatment.  The 10-year ASCVD risk score (Arnett DK, et al., 2019) is: 4%   Values used to calculate the score:     Age: 61 years     Sex: Female     Is Non-Hispanic African American: No     Diabetic: No     Tobacco smoker: No     Systolic Blood Pressure: 126 mmHg     Is BP treated: No     HDL Cholesterol: 59 mg/dL     Total Cholesterol: 216 mg/dL  Her gyn orders labs annually.

## 2021-01-18 NOTE — Assessment & Plan Note (Signed)
Wt has been stable. She understands the benefits of wt loss. Consistency with healthy diet and physical activity encouraged.

## 2021-02-18 ENCOUNTER — Encounter: Payer: Self-pay | Admitting: Family Medicine

## 2021-02-22 ENCOUNTER — Other Ambulatory Visit: Payer: Self-pay

## 2021-02-22 DIAGNOSIS — Z1211 Encounter for screening for malignant neoplasm of colon: Secondary | ICD-10-CM

## 2021-03-07 LAB — COLOGUARD: COLOGUARD: NEGATIVE

## 2021-03-26 ENCOUNTER — Emergency Department (HOSPITAL_BASED_OUTPATIENT_CLINIC_OR_DEPARTMENT_OTHER): Payer: PRIVATE HEALTH INSURANCE

## 2021-03-26 ENCOUNTER — Other Ambulatory Visit: Payer: Self-pay

## 2021-03-26 ENCOUNTER — Emergency Department (HOSPITAL_BASED_OUTPATIENT_CLINIC_OR_DEPARTMENT_OTHER)
Admission: EM | Admit: 2021-03-26 | Discharge: 2021-03-26 | Disposition: A | Discharge status: Home / Self Care | Payer: PRIVATE HEALTH INSURANCE | Attending: Emergency Medicine | Admitting: Emergency Medicine

## 2021-03-26 ENCOUNTER — Encounter (HOSPITAL_BASED_OUTPATIENT_CLINIC_OR_DEPARTMENT_OTHER): Payer: Self-pay | Admitting: Emergency Medicine

## 2021-03-26 DIAGNOSIS — R197 Diarrhea, unspecified: Secondary | ICD-10-CM | POA: Insufficient documentation

## 2021-03-26 DIAGNOSIS — Z8616 Personal history of COVID-19: Secondary | ICD-10-CM | POA: Insufficient documentation

## 2021-03-26 DIAGNOSIS — R1032 Left lower quadrant pain: Secondary | ICD-10-CM | POA: Insufficient documentation

## 2021-03-26 DIAGNOSIS — Z20822 Contact with and (suspected) exposure to covid-19: Secondary | ICD-10-CM | POA: Insufficient documentation

## 2021-03-26 DIAGNOSIS — R110 Nausea: Secondary | ICD-10-CM | POA: Diagnosis not present

## 2021-03-26 LAB — URINALYSIS, ROUTINE W REFLEX MICROSCOPIC
Bilirubin Urine: NEGATIVE
Glucose, UA: NEGATIVE mg/dL
Ketones, ur: NEGATIVE mg/dL
Leukocytes,Ua: NEGATIVE
Nitrite: NEGATIVE
Protein, ur: NEGATIVE mg/dL
Specific Gravity, Urine: <=1.005 (ref 1.005–1.030)
pH: 6 (ref 5.0–8.0)

## 2021-03-26 LAB — COMPREHENSIVE METABOLIC PANEL
ALT: 36 U/L (ref 0–44)
AST: 26 U/L (ref 15–41)
Albumin: 4.5 g/dL (ref 3.5–5.0)
Alkaline Phosphatase: 80 U/L (ref 38–126)
Anion gap: 10 (ref 5–15)
BUN: 10 mg/dL (ref 8–23)
CO2: 24 mmol/L (ref 22–32)
Calcium: 9.4 mg/dL (ref 8.9–10.3)
Chloride: 104 mmol/L (ref 98–111)
Creatinine, Ser: 0.67 mg/dL (ref 0.44–1.00)
GFR, Estimated: >60 mL/min (ref >60)
Glucose, Bld: 100 mg/dL — ABNORMAL HIGH (ref 70–99)
Potassium: 4 mmol/L (ref 3.5–5.1)
Sodium: 138 mmol/L (ref 135–145)
Total Bilirubin: 0.7 mg/dL (ref 0.3–1.2)
Total Protein: 7.8 g/dL (ref 6.5–8.1)

## 2021-03-26 LAB — RESP PANEL BY RT-PCR (FLU A&B, COVID) ARPGX2
Influenza A by PCR: NEGATIVE
Influenza B by PCR: NEGATIVE
SARS Coronavirus 2 by RT PCR: NEGATIVE

## 2021-03-26 LAB — LIPASE, BLOOD: Lipase: 29 U/L (ref 11–51)

## 2021-03-26 LAB — CBC
HCT: 47.4 % — ABNORMAL HIGH (ref 36.0–46.0)
Hemoglobin: 15.7 g/dL — ABNORMAL HIGH (ref 12.0–15.0)
MCH: 29.3 pg (ref 26.0–34.0)
MCHC: 33.1 g/dL (ref 30.0–36.0)
MCV: 88.4 fL (ref 80.0–100.0)
Platelets: 301 10*3/uL (ref 150–400)
RBC: 5.36 MIL/uL — ABNORMAL HIGH (ref 3.87–5.11)
RDW: 12.6 % (ref 11.5–15.5)
WBC: 10.8 10*3/uL — ABNORMAL HIGH (ref 4.0–10.5)
nRBC: 0 % (ref 0.0–0.2)

## 2021-03-26 LAB — URINALYSIS, MICROSCOPIC (REFLEX)

## 2021-03-26 MED ORDER — IOHEXOL 300 MG/ML  SOLN
100.0000 mL | Freq: Once | INTRAMUSCULAR | Status: AC | PRN
  Administered 2021-03-26: 100 mL via INTRAVENOUS

## 2021-03-26 MED ORDER — DICYCLOMINE HCL 10 MG PO CAPS
10.0000 mg | ORAL_CAPSULE | Freq: Once | ORAL | Status: AC
  Administered 2021-03-26: 10 mg via ORAL
  Filled 2021-03-26: qty 1

## 2021-03-26 MED ORDER — SUCRALFATE 1 G PO TABS: 1.0000 g | ORAL_TABLET | Freq: Three times a day (TID) | ORAL | 0 refills | Status: DC

## 2021-03-26 MED ORDER — ALUM & MAG HYDROXIDE-SIMETH 200-200-20 MG/5ML PO SUSP
30.0000 mL | Freq: Once | ORAL | Status: AC
  Administered 2021-03-26: 30 mL via ORAL
  Filled 2021-03-26: qty 30

## 2021-03-26 MED ORDER — DICYCLOMINE HCL 20 MG PO TABS: 10.0000 mg | ORAL_TABLET | Freq: Two times a day (BID) | ORAL | 0 refills | Status: DC

## 2021-03-26 MED ORDER — LIDOCAINE VISCOUS HCL 2 % MT SOLN
15.0000 mL | Freq: Once | OROMUCOSAL | Status: AC
  Administered 2021-03-26: 15 mL via ORAL
  Filled 2021-03-26: qty 15

## 2021-03-26 MED ORDER — SODIUM CHLORIDE 0.9 % IV BOLUS
1000.0000 mL | Freq: Once | INTRAVENOUS | Status: AC
  Administered 2021-03-26: 1000 mL via INTRAVENOUS

## 2021-03-26 MED ORDER — FAMOTIDINE IN NACL 20-0.9 MG/50ML-% IV SOLN
20.0000 mg | Freq: Once | INTRAVENOUS | Status: AC
  Administered 2021-03-26: 20 mg via INTRAVENOUS
  Filled 2021-03-26: qty 50

## 2021-03-26 NOTE — ED Provider Notes (Signed)
?[facility] EMERGENCY DEPARTMENT ?Provider Note ? ? ?CSN: [id number] ?Arrival date & time: 03/26/21  0900 ? ?  ? ?History ? ?Chief Complaint  ?Patient presents with  ? Diarrhea  ? ? ?Gabrielle Patterson is a 61 y.o. female. ? ?This is a 61 y.o. female with significant medical history as below, including reticulosis, MS who presents to the ED with complaint of diarrhea.  Patient with diarrhea x3 days.  Copious amounts of stool.  Liquid.  No formed stool.  Noticed some blood in her stool this morning.  Left lower quadrant abdominal pain.  Poor p.o. intake last 3 days.  P.o. intake makes her stomach discomfort worse.  She has nausea without vomiting.  No changes urination.  No rashes.  No recent travel or sick contacts.  No recent hospitalization or antibiotic use.  No history of C. difficile. ? ? ? ?Past Medical History: ?No date: Allergic rhinitis ?No date: Allergy ?No date: COVID ?    Comment:  2021, 2022 ?No date: Diverticulosis ?2019: History of cataract removal with insertion of prosthetic lens ?    Comment:  Bilateral ?No date: Multiple sclerosis (HCC) ?No date: Urine incontinence ?No date: Vitamin D deficiency ? ?Past Surgical History: ?08/08/2017: BLADDER SUSPENSION; N/A ?    Comment:  Procedure: TRANSVAGINAL TAPE (TVT) PROCEDURE exact  ?             midurethral sling;  Surgeon: Patton Salles,  ?             MD;  Location: [facility];  Service:  ?             Gynecology;  Laterality: N/A; ?11/08: BREAST SURGERY ?    Comment:  Rt.breast Bx--Florid fibrocystic changes--high risk but  ?             negative for neoplasm ?08/08/2017: CYSTOSCOPY; N/A ?    Comment:  Procedure: CYSTOSCOPY;  Surgeon: Ardell Isaacs, [place] ?             E, MD;  Location: [facility];  Service:  ?             Gynecology;  Laterality: N/A; ?03-07-05: ENDOMETRIAL ABLATION ?    Comment:  -HTA ?12-15-04: ENDOMETRIAL BIOPSY ?    Comment:  --benign endocervical polyp--Dr. Romine ?08/08/2017:  RECTOCELE REPAIR; N/A ?    Comment:  Procedure: POSTERIOR REPAIR (RECTOCELE);  Surgeon:  ?             Patton Salles, MD;  Location: [place]  ?             [facility];  Service: Gynecology;  Laterality: N/A; ?11/06: throat tumor ?    Comment:  --benign ?No date: TUBAL LIGATION  ? ? ?The history is provided by the patient. No language interpreter was used.  ?Diarrhea ?Associated symptoms: abdominal pain   ?Associated symptoms: no chills, no fever, no headaches and no vomiting   ? ?  ? ?Home Medications ?Prior to Admission medications   ?Medication Sig Start Date End Date Taking? Authorizing Provider  ?dicyclomine (BENTYL) 20 MG tablet Take 0.5 tablets (10 mg total) by mouth 2 (two) times daily for 5 days. 03/26/21 03/31/21 Yes Sloan Leiter, DO  ?sucralfate (CARAFATE) 1 g tablet Take 1 tablet (1 g total) by mouth 4 (four) times daily -  with meals and at bedtime for 7 days. 03/26/21 04/02/21 Yes Sloan Leiter,  DO  ?   ? ?Allergies    ?Penicillins and Sulfa antibiotics   ? ?Review of Systems   ?Review of Systems  ?Constitutional:  Positive for appetite change. Negative for chills and fever.  ?HENT:  Negative for facial swelling and trouble swallowing.   ?Eyes:  Negative for photophobia and visual disturbance.  ?Respiratory:  Negative for cough and shortness of breath.   ?Cardiovascular:  Negative for chest pain and palpitations.  ?Gastrointestinal:  Positive for abdominal pain, diarrhea and nausea. Negative for vomiting.  ?Endocrine: Negative for polydipsia and polyuria.  ?Genitourinary:  Negative for difficulty urinating and hematuria.  ?Musculoskeletal:  Negative for gait problem and joint swelling.  ?Skin:  Negative for pallor and rash.  ?Neurological:  Negative for syncope and headaches.  ?Psychiatric/Behavioral:  Negative for agitation and confusion.   ? ?Physical Exam ?Updated Vital Signs ?BP (!) 148/61 (BP Location: Right Arm)   Pulse 63   Temp 97.9 ?F (36.6 ?C) (Oral)   Resp 18   Ht 5\' 1"   (1.549 m)   Wt 72.1 kg   LMP 01/03/2009   SpO2 97%   BMI 30.04 kg/m?  ?Physical Exam ?Vitals and nursing note reviewed.  ?Constitutional:   ?   General: She is not in acute distress. ?   Appearance: Normal appearance.  ?HENT:  ?   Head: Normocephalic and atraumatic.  ?   Right Ear: External ear normal.  ?   Left Ear: External ear normal.  ?   Nose: Nose normal.  ?   Mouth/Throat:  ?   Mouth: Mucous membranes are moist.  ?Eyes:  ?   General: No scleral icterus.    ?   Right eye: No discharge.     ?   Left eye: No discharge.  ?Cardiovascular:  ?   Rate and Rhythm: Normal rate and regular rhythm.  ?   Pulses: Normal pulses.  ?   Heart sounds: Normal heart sounds.  ?Pulmonary:  ?   Effort: Pulmonary effort is normal. No respiratory distress.  ?   Breath sounds: Normal breath sounds.  ?Abdominal:  ?   General: Abdomen is flat.  ?   Palpations: Abdomen is soft.  ?   Tenderness: There is abdominal tenderness.  ?   Comments: Mild left lower quadrant tenderness  ?Musculoskeletal:     ?   General: Normal range of motion.  ?   Cervical back: Normal range of motion.  ?   Right lower leg: No edema.  ?   Left lower leg: No edema.  ?Skin: ?   General: Skin is warm and dry.  ?   Capillary Refill: Capillary refill takes less than 2 seconds.  ?Neurological:  ?   Mental Status: She is alert.  ?Psychiatric:     ?   Mood and Affect: Mood normal.     ?   Behavior: Behavior normal.  ? ? ?ED Results / Procedures / Treatments   ?Labs ?(all labs ordered are listed, but only abnormal results are displayed) ?Labs Reviewed  ?COMPREHENSIVE METABOLIC PANEL - Abnormal; Notable for the following components:  ?    Result Value  ? Glucose, Bld 100 (*)   ? All other components within normal limits  ?CBC - Abnormal; Notable for the following components:  ? WBC 10.8 (*)   ? RBC 5.36 (*)   ? Hemoglobin 15.7 (*)   ? HCT 47.4 (*)   ? All other components within normal limits  ?URINALYSIS, ROUTINE W REFLEX  MICROSCOPIC - Abnormal; Notable for the  following components:  ? Hgb urine dipstick TRACE (*)   ? All other components within normal limits  ?URINALYSIS, MICROSCOPIC (REFLEX) - Abnormal; Notable for the following components:  ? Bacteria, UA FEW (*)   ? All other components within normal limits  ?RESP PANEL BY RT-PCR (FLU A&B, COVID) [id number]  ?LIPASE, BLOOD  ? ? ?EKG ?None ? ?Radiology ?CT ABDOMEN PELVIS W CONTRAST ? ?Result Date: 03/26/2021 ?CLINICAL DATA:  Left lower quadrant pain for 5 days. Diarrhea. Hematochezia. EXAM: CT ABDOMEN AND PELVIS WITH CONTRAST TECHNIQUE: Multidetector CT imaging of the abdomen and pelvis was performed using the standard protocol following bolus administration of intravenous contrast. RADIATION DOSE REDUCTION: This exam was performed according to the departmental dose-optimization program which includes automated exposure control, adjustment of the mA and/or kV according to patient size and/or use of iterative reconstruction technique. CONTRAST:  OMNIPAQUE IOHEXOL 300 MG/ML  SOLN COMPARISON:  Noncontrast CT on 01/11/2017 FINDINGS: Lower Chest: No acute findings. Hepatobiliary: No hepatic masses identified. Gallbladder is unremarkable. No evidence of biliary ductal dilatation. Pancreas:  No mass or inflammatory changes. Spleen: Within normal limits in size and appearance. Adrenals/Urinary Tract: No masses identified. 6 cm left renal cyst again noted. No evidence of ureteral calculi or hydronephrosis. Stomach/Bowel: Diverticulosis is seen mainly involving the descending and sigmoid colon, however there is no evidence of diverticulitis. Normal appendix visualized. No evidence of bowel obstruction, inflammatory process, or abnormal fluid collections. Vascular/Lymphatic: No pathologically enlarged lymph nodes. No acute vascular findings. Aortic atherosclerotic calcification noted. Reproductive:  No mass or other significant abnormality. Other:  None. Musculoskeletal:  No suspicious bone lesions identified. IMPRESSION: Colonic  diverticulosis, without radiographic evidence of diverticulitis or other acute findings. Aortic Atherosclerosis (ICD10-I70.0). Electronically Signed   By: Danae Orleans M.D.   On: 03/26/2021 11:49   ? ?Procedures ?Procedu

## 2021-03-26 NOTE — Discharge Instructions (Addendum)
Please follow-up with your primary care doctor, would recommend giving your PCP a stool sample if diarrhea persist ? ?You may use OTC antidiarrheals if needed ? ?It was a pleasure caring for you today in the emergency department. ? ?Please return to the emergency department for any worsening or worrisome symptoms. ? ?

## 2021-03-26 NOTE — ED Triage Notes (Signed)
Pt c/o diarrhea, spasms in her rectum, abd pain since Monday. States she noticed bright red blood when she wiped this morning.  ?

## 2021-06-09 HISTORY — PX: LEG SURGERY: SHX1003

## 2021-07-22 ENCOUNTER — Ambulatory Visit: Payer: No Typology Code available for payment source | Attending: Orthopedic Surgery

## 2021-07-22 ENCOUNTER — Other Ambulatory Visit: Payer: Self-pay

## 2021-07-22 DIAGNOSIS — R262 Difficulty in walking, not elsewhere classified: Secondary | ICD-10-CM | POA: Insufficient documentation

## 2021-07-22 DIAGNOSIS — M25671 Stiffness of right ankle, not elsewhere classified: Secondary | ICD-10-CM | POA: Diagnosis not present

## 2021-07-22 NOTE — Therapy (Signed)
OUTPATIENT PHYSICAL THERAPY LOWER EXTREMITY EVALUATION   Patient Name: Gabrielle Patterson MRN: [id number] DOB:Jan 28, 1960, 61 y.o., female Today's Date: 07/22/2021   PT End of Session - 07/22/21 0952     Visit Number 1    Number of Visits 12    Date for PT Re-Evaluation 10/01/21    PT Start Time 0954    PT Stop Time 1038    PT Time Calculation (min) 44 min    Activity Tolerance Patient tolerated treatment well    Behavior During Therapy [facility] for tasks assessed/performed             Past Medical History:  Diagnosis Date   Allergic rhinitis    Allergy    COVID    2021, 2022   Diverticulosis    History of cataract removal with insertion of prosthetic lens 2019   Bilateral   Multiple sclerosis (HCC)    Urine incontinence    Vitamin D deficiency    Past Surgical History:  Procedure Laterality Date   BLADDER SUSPENSION N/A 08/08/2017   Procedure: TRANSVAGINAL TAPE (TVT) PROCEDURE exact midurethral sling;  Surgeon: Patton Salles, MD;  Location: [facility];  Service: Gynecology;  Laterality: N/A;   BREAST SURGERY  11/08   Rt.breast Bx--Florid fibrocystic changes--high risk but negative for neoplasm   CYSTOSCOPY N/A 08/08/2017   Procedure: CYSTOSCOPY;  Surgeon: Patton Salles, MD;  Location: [facility];  Service: Gynecology;  Laterality: N/A;   ENDOMETRIAL ABLATION  03-07-05   -HTA   ENDOMETRIAL BIOPSY  12-15-04   --benign endocervical polyp--Dr. Tresa Res   RECTOCELE REPAIR N/A 08/08/2017   Procedure: POSTERIOR REPAIR (RECTOCELE);  Surgeon: Patton Salles, MD;  Location: [facility];  Service: Gynecology;  Laterality: N/A;   throat tumor  11/06   --benign   TUBAL LIGATION     Patient Active Problem List   Diagnosis Date Noted   Constipation 01/18/2021   Hyperlipidemia 07/31/2017   Class 1 obesity with body mass index (BMI) of 30.0 to 30.9 in adult 07/31/2017   Mixed incontinence 05/02/2017     PCP: [place], Betty G, MD  REFERRING PROVIDER: Frederico Hamman, MD  REFERRING DIAG: ORIF Right Ankle DOS 06/09/21  THERAPY DIAG:  Stiffness of right ankle, not elsewhere classified  Difficulty in walking, not elsewhere classified  Rationale for Evaluation and Treatment Rehabilitation  ONSET DATE: 06/05/21 (initial injury) 6/7 (ORIF)   SUBJECTIVE:   SUBJECTIVE STATEMENT: Patient reports that she was [place] when she was swinging on a rope to land in the water. However, she landed wrong and fractured her right ankle. She then had surgery for an ORIF on 6/7. She has been using a wheelchair to get around since her surgery. She has tried standing on her foot without her boot and this "wasn't too bad."   PERTINENT HISTORY: none  PAIN:  Are you having pain? Yes: NPRS scale: 1-2/10 Pain location: right ankle  Pain description: tender, aching Aggravating factors: sitting for 1 hour,  Relieving factors: sitting and resting  PRECAUTIONS: None  WEIGHT BEARING RESTRICTIONS Yes WBAT   FALLS:  Has patient fallen in last 6 months? No  LIVING ENVIRONMENT: Lives with: lives with their family Lives in: House/apartment Stairs: Yes: Internal: 20 steps; on right going up and External: 3 steps; on left going up Has following equipment at home: None  NEXT MD FOLLOW UP: 3 weeks  OCCUPATION: computer repair  PLOF: Independent  PATIENT GOALS: return kayaking, tennis, and walk her dogs     OBJECTIVE:   PATIENT SURVEYS:  FOTO 50.88  COGNITION:  Overall cognitive status: Within functional limits for tasks assessed     SENSATION: WFL  PALPATION: TTP: right anterior joint line JOINT MOBILITY:   Mild right talocrural joint mobility with no pain  Midfoot and metatarsal joint mobility: WFL and nonpainful   LOWER EXTREMITY ROM:  Active ROM Right eval Left eval  Hip flexion    Hip extension    Hip abduction    Hip adduction    Hip internal rotation    Hip  external rotation    Knee flexion    Knee extension    Ankle dorsiflexion 0 10  Ankle plantarflexion 24 54  Ankle inversion 16 40  Ankle eversion -2 8   (Blank rows = not tested)  GAIT: Assistive device utilized:  CAM boot Level of assistance: Modified independence   TODAY'S TREATMENT:                                   7/20 EXERCISE LOG  Exercise Repetitions and Resistance Comments  Gastroc stretch  RLE; 4 x 30 seconds   Heel / toe raises (seated) 30 reps    Seated inversion / eversion 15 reps   Standing weight shift  Forward and lateral; 20 reps each         Blank cell = exercise not performed today    PATIENT EDUCATION:  Education details: healing, POC, and prognosis Person educated: Patient Education method: Explanation Education comprehension: verbalized understanding   HOME EXERCISE PROGRAM: [id number]  ASSESSMENT:  CLINICAL IMPRESSION: Patient is a 61 y.o. female who was seen today for physical therapy evaluation and treatment following a right ankle ORIF on 06/09/21. She presented with low pain severity and irritability with palpation along her anterior joint line recreating mild soreness. However, her primary limitation at the time is her limited right ankle ROM. She was provided a HEP which she was able to properly demonstrate. Recommend that she continue with skilled physical therapy to address her remaining impairments to return to her prior level of function.    OBJECTIVE IMPAIRMENTS Abnormal gait, decreased activity tolerance, decreased balance, decreased mobility, difficulty walking, decreased ROM, decreased strength, hypomobility, increased edema, and pain.   ACTIVITY LIMITATIONS lifting, standing, squatting, stairs, transfers, and locomotion level  PARTICIPATION LIMITATIONS: cleaning, laundry, driving, shopping, community activity, and yard work  PERSONAL FACTORS Transportation are also affecting patient's functional outcome.   REHAB POTENTIAL:  Excellent  CLINICAL DECISION MAKING: Stable/uncomplicated  EVALUATION COMPLEXITY: Low   GOALS: Goals reviewed with patient? Yes  SHORT TERM GOALS: Target date: 08/12/2021  Patient will be independent with her initial HEP.  Baseline: Goal status: INITIAL  2.  Patient will be able to demonstrate at least 5 degrees of active right ankle dorsiflexion for improved gait mechanics.  Baseline:  Goal status: INITIAL  LONG TERM GOALS: Target date: 09/02/2021   Patient will be independent with her advanced HEP.  Baseline:  Goal status: INITIAL  2.  Patient will be able to ambulate with no significant gait deviations for improved functional mobility.  Baseline:  Goal status: INITIAL  3.  Patient will be able to navigate at least 4 steps with a reciprocal pattern for improved functional mobility.  Baseline:  Goal status: INITIAL  4.  Patient will be able to walk  at least 30 minutes for improved function walking her dogs.  Baseline:  Goal status: INITIAL   PLAN: PT FREQUENCY: 1-2x/week  PT DURATION: 6 weeks  PLANNED INTERVENTIONS: Therapeutic exercises, Therapeutic activity, Neuromuscular re-education, Balance training, Gait training, Patient/Family education, Self Care, Joint mobilization, Stair training, Electrical stimulation, Cryotherapy, Moist heat, Taping, Vasopneumatic device, Contrast bath, Manual therapy, and Re-evaluation  PLAN FOR NEXT SESSION: nustep, BAPS PF/DF and IV/EV, rocker board, and modalities as needed   Granville Lewis, PT 07/22/2021, 6:40 PM

## 2021-07-29 ENCOUNTER — Ambulatory Visit: Payer: No Typology Code available for payment source

## 2021-07-29 DIAGNOSIS — R262 Difficulty in walking, not elsewhere classified: Secondary | ICD-10-CM

## 2021-07-29 DIAGNOSIS — M25671 Stiffness of right ankle, not elsewhere classified: Secondary | ICD-10-CM | POA: Diagnosis not present

## 2021-07-29 NOTE — Therapy (Signed)
OUTPATIENT PHYSICAL THERAPY LOWER EXTREMITY TREATMENT   Patient Name: Gabrielle Patterson MRN: [id number] DOB:15-Sep-1960, 61 y.o., female Today's Date: 07/29/2021   PT End of Session - 07/29/21 1048     Visit Number 2    Number of Visits 12    Date for PT Re-Evaluation 10/01/21    PT Start Time 1038    PT Stop Time 1118    PT Time Calculation (min) 40 min    Activity Tolerance Patient tolerated treatment well    Behavior During Therapy [facility] for tasks assessed/performed              Past Medical History:  Diagnosis Date   Allergic rhinitis    Allergy    COVID    2021, 2022   Diverticulosis    History of cataract removal with insertion of prosthetic lens 2019   Bilateral   Multiple sclerosis (HCC)    Urine incontinence    Vitamin D deficiency    Past Surgical History:  Procedure Laterality Date   BLADDER SUSPENSION N/A 08/08/2017   Procedure: TRANSVAGINAL TAPE (TVT) PROCEDURE exact midurethral sling;  Surgeon: Patton Salles, MD;  Location: [facility] [place];  Service: Gynecology;  Laterality: N/A;   BREAST SURGERY  11/08   Rt.breast Bx--Florid fibrocystic changes--high risk but negative for neoplasm   CYSTOSCOPY N/A 08/08/2017   Procedure: CYSTOSCOPY;  Surgeon: Patton Salles, MD;  Location: [facility];  Service: Gynecology;  Laterality: N/A;   ENDOMETRIAL ABLATION  03-07-05   -HTA   ENDOMETRIAL BIOPSY  12-15-04   --benign endocervical polyp--Dr. Tresa Res   RECTOCELE REPAIR N/A 08/08/2017   Procedure: POSTERIOR REPAIR (RECTOCELE);  Surgeon: Patton Salles, MD;  Location: [facility];  Service: Gynecology;  Laterality: N/A;   throat tumor  11/06   --benign   TUBAL LIGATION     Patient Active Problem List   Diagnosis Date Noted   Constipation 01/18/2021   Hyperlipidemia 07/31/2017   Class 1 obesity with body mass index (BMI) of 30.0 to 30.9 in adult 07/31/2017   Mixed incontinence 05/02/2017     PCP: [place], Betty G, MD  REFERRING PROVIDER: Frederico Hamman, MD  REFERRING DIAG: ORIF Right Ankle DOS 06/09/21  THERAPY DIAG:  Stiffness of right ankle, not elsewhere classified  Difficulty in walking, not elsewhere classified  Rationale for Evaluation and Treatment Rehabilitation  ONSET DATE: 06/05/21 (initial injury) 6/7 (ORIF)   SUBJECTIVE:   SUBJECTIVE STATEMENT: Patient reports that her ankle is aching a little, but it feels good. She has tried to walk some without her boot.   PERTINENT HISTORY: none  PAIN:  Are you having pain? Yes: NPRS scale: 1/10 Pain location: right ankle  Pain description: tender, aching Aggravating factors: sitting for 1 hour,  Relieving factors: sitting and resting  PRECAUTIONS: None  WEIGHT BEARING RESTRICTIONS Yes WBAT   FALLS:  Has patient fallen in last 6 months? No  LIVING ENVIRONMENT: Lives with: lives with their family Lives in: House/apartment Stairs: Yes: Internal: 20 steps; on right going up and External: 3 steps; on left going up Has following equipment at home: None  NEXT MD FOLLOW UP: 3 weeks  OCCUPATION: computer repair  PLOF: Independent  PATIENT GOALS: return kayaking, tennis, and walk her dogs     OBJECTIVE:   PATIENT SURVEYS:  FOTO 50.88  COGNITION:  Overall cognitive status: Within functional limits for tasks assessed     SENSATION: [facility]  PALPATION: TTP: right anterior joint line JOINT MOBILITY:   Mild right talocrural joint mobility with no pain  Midfoot and metatarsal joint mobility: WFL and nonpainful   LOWER EXTREMITY ROM:  Active ROM Right eval Left eval  Hip flexion    Hip extension    Hip abduction    Hip adduction    Hip internal rotation    Hip external rotation    Knee flexion    Knee extension    Ankle dorsiflexion 0 10  Ankle plantarflexion 24 54  Ankle inversion 16 40  Ankle eversion -2 8   (Blank rows = not tested)  GAIT: Assistive device utilized:  CAM  boot Level of assistance: Modified independence   TODAY'S TREATMENT:                                   7/27 EXERCISE LOG  Exercise Repetitions and Resistance Comments  Nustep L4 x 12 minutes   Rocker board 5 minutes   BAPS DF/PF and IV/EV L1 x 2 minutes each   Resisted PF Blue t-band x 20 reps   Tandem balance On foam; 4 x 30 seconds each     Blank cell = exercise not performed today                                    7/20 EXERCISE LOG  Exercise Repetitions and Resistance Comments  Gastroc stretch  RLE; 4 x 30 seconds   Heel / toe raises (seated) 30 reps    Seated inversion / eversion 15 reps   Standing weight shift  Forward and lateral; 20 reps each         Blank cell = exercise not performed today    PATIENT EDUCATION:  Education details: healing, return to activity Person educated: Patient Education method: Explanation Education comprehension: verbalized understanding   HOME EXERCISE PROGRAM: [id number]  ASSESSMENT:  CLINICAL IMPRESSION: Patient was introduced to multiple new interventions for improved ankle mobility and stability. She required minimal cueing with BAPS inversion and eversion to prevent hip internal and external rotation to isolate ankle mobility. She experienced no pain or discomfort with any of today's interventions. She reported that her ankle was aching a little upon the conclusion of treatment, but otherwise it felt ok. She continues to require skilled physical therapy to address her remaining impairments to return to her prior level of function.    OBJECTIVE IMPAIRMENTS Abnormal gait, decreased activity tolerance, decreased balance, decreased mobility, difficulty walking, decreased ROM, decreased strength, hypomobility, increased edema, and pain.   ACTIVITY LIMITATIONS lifting, standing, squatting, stairs, transfers, and locomotion level  PARTICIPATION LIMITATIONS: cleaning, laundry, driving, shopping, community activity, and yard work  PERSONAL  FACTORS Transportation are also affecting patient's functional outcome.   REHAB POTENTIAL: Excellent  CLINICAL DECISION MAKING: Stable/uncomplicated  EVALUATION COMPLEXITY: Low   GOALS: Goals reviewed with patient? Yes  SHORT TERM GOALS: Target date: 08/12/2021  Patient will be independent with her initial HEP.  Baseline: Goal status: INITIAL  2.  Patient will be able to demonstrate at least 5 degrees of active right ankle dorsiflexion for improved gait mechanics.  Baseline:  Goal status: INITIAL  LONG TERM GOALS: Target date: 09/02/2021   Patient will be independent with her advanced HEP.  Baseline:  Goal status: INITIAL  2.  Patient will be able to ambulate  with no significant gait deviations for improved functional mobility.  Baseline:  Goal status: INITIAL  3.  Patient will be able to navigate at least 4 steps with a reciprocal pattern for improved functional mobility.  Baseline:  Goal status: INITIAL  4.  Patient will be able to walk at least 30 minutes for improved function walking her dogs.  Baseline:  Goal status: INITIAL   PLAN: PT FREQUENCY: 1-2x/week  PT DURATION: 6 weeks  PLANNED INTERVENTIONS: Therapeutic exercises, Therapeutic activity, Neuromuscular re-education, Balance training, Gait training, Patient/Family education, Self Care, Joint mobilization, Stair training, Electrical stimulation, Cryotherapy, Moist heat, Taping, Vasopneumatic device, Contrast bath, Manual therapy, and Re-evaluation  PLAN FOR NEXT SESSION: nustep, BAPS PF/DF and IV/EV, rocker board, and modalities as needed   Darlin Coco, PT 07/29/2021, 12:12 PM

## 2021-08-05 ENCOUNTER — Ambulatory Visit: Payer: No Typology Code available for payment source | Attending: Family Medicine

## 2021-08-05 DIAGNOSIS — M25671 Stiffness of right ankle, not elsewhere classified: Secondary | ICD-10-CM | POA: Diagnosis present

## 2021-08-05 DIAGNOSIS — R262 Difficulty in walking, not elsewhere classified: Secondary | ICD-10-CM | POA: Diagnosis present

## 2021-08-05 NOTE — Therapy (Signed)
OUTPATIENT PHYSICAL THERAPY LOWER EXTREMITY TREATMENT   Patient Name: Gabrielle Patterson MRN: [id number] DOB:08-15-1960, 61 y.o., female Today's Date: 08/05/2021   PT End of Session - 08/05/21 1031     Visit Number 3    Number of Visits 12    Date for PT Re-Evaluation 10/01/21    PT Start Time 1031    PT Stop Time 1115    PT Time Calculation (min) 44 min    Activity Tolerance Patient tolerated treatment well    Behavior During Therapy [facility] for tasks assessed/performed               Past Medical History:  Diagnosis Date   Allergic rhinitis    Allergy    COVID    2021, 2022   Diverticulosis    History of cataract removal with insertion of prosthetic lens 2019   Bilateral   Multiple sclerosis ([place])    Urine incontinence    Vitamin D deficiency    Past Surgical History:  Procedure Laterality Date   BLADDER SUSPENSION N/A 08/08/2017   Procedure: TRANSVAGINAL TAPE (TVT) PROCEDURE exact midurethral sling;  Surgeon: Nunzio Cobbs, MD;  Location: [facility];  Service: Gynecology;  Laterality: N/A;   BREAST SURGERY  11/08   Rt.breast Bx--Florid fibrocystic changes--high risk but negative for neoplasm   CYSTOSCOPY N/A 08/08/2017   Procedure: CYSTOSCOPY;  Surgeon: Nunzio Cobbs, MD;  Location: [facility];  Service: Gynecology;  Laterality: N/A;   ENDOMETRIAL ABLATION  03-07-05   -HTA   ENDOMETRIAL BIOPSY  12-15-04   --benign endocervical polyp--Dr. 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   TUBAL LIGATION     Patient Active Problem List   Diagnosis Date Noted   Constipation 01/18/2021   Hyperlipidemia 07/31/2017   Class 1 obesity with body mass index (BMI) of 30.0 to 30.9 in adult 07/31/2017   Mixed incontinence 05/02/2017     PCP: Martinique, Betty G, MD  REFERRING PROVIDER: Earlie Server, MD  REFERRING DIAG: ORIF Right Ankle DOS 06/09/21  THERAPY DIAG:  Stiffness of right ankle, not elsewhere classified  Difficulty in walking, not elsewhere classified  Rationale for Evaluation and Treatment Rehabilitation  ONSET DATE: 06/05/21 (initial injury) 6/7 (ORIF)   SUBJECTIVE:   SUBJECTIVE STATEMENT: Patient reports that she was sore for about two days after her last appointment, but it got better. She notes that it still gets sore after she is standing or walking for long periods .   PERTINENT HISTORY: none  PAIN:  Are you having pain? Yes: NPRS scale: 0/10 Pain location: right ankle  Pain description: tender, aching Aggravating factors: sitting for 1 hour,  Relieving factors: sitting and resting  PRECAUTIONS: None  WEIGHT BEARING RESTRICTIONS Yes WBAT   FALLS:  Has patient fallen in last 6 months? No  LIVING ENVIRONMENT: Lives with: lives with their family Lives in: House/apartment Stairs: Yes: Internal: 20 steps; on right going up and External: 3 steps; on left going up Has following equipment at home: None  NEXT MD FOLLOW UP: 3 weeks  OCCUPATION: computer repair  PLOF: Independent  PATIENT GOALS: return kayaking, tennis, and walk her dogs     OBJECTIVE: all assessments were performed on 7/20 unless otherwise noted  PATIENT SURVEYS:  FOTO 50.88  COGNITION:  Overall cognitive status: Within functional limits for tasks assessed     SENSATION: WFL  PALPATION: TTP: right anterior joint line JOINT MOBILITY:   Mild right talocrural joint mobility with no pain  Midfoot and metatarsal joint mobility: WFL and nonpainful   LOWER EXTREMITY ROM:  Active ROM Right eval Left eval  Hip flexion    Hip extension    Hip abduction    Hip adduction    Hip internal rotation    Hip external rotation    Knee flexion    Knee extension    Ankle dorsiflexion 0 10  Ankle plantarflexion  24 54  Ankle inversion 16 40  Ankle eversion -2 8   (Blank rows = not tested)  GAIT: Assistive device utilized:  CAM boot Level of assistance: Modified independence   TODAY'S TREATMENT:                                   8/3 EXERCISE LOG  Exercise Repetitions and Resistance Comments  Recumbent bike  L3 x 12 minutes   BOSU squats  3 minutes   BOSU marching  3 minutes   BAPS IV/EV and circles  2 minutes each    Heel raises 3 minutes   Lunges 30 reps each    Tandem walking  10 laps    Blank cell = exercise not performed today                                    7/27 EXERCISE LOG  Exercise Repetitions and Resistance Comments  Nustep L4 x 12 minutes   Rocker board 5 minutes   BAPS DF/PF and IV/EV L1 x 2 minutes each   Resisted PF Blue t-band x 20 reps   Tandem balance On foam; 4 x 30 seconds each     Blank cell = exercise not performed today                                    7/20 EXERCISE LOG  Exercise Repetitions and Resistance Comments  Gastroc stretch  RLE; 4 x 30 seconds   Heel / toe raises (seated) 30 reps    Seated inversion / eversion 15 reps   Standing weight shift  Forward and lateral; 20 reps each         Blank cell = exercise not performed today    PATIENT EDUCATION:  Education details: healing, walking Person educated: Patient Education method: Explanation Education comprehension: verbalized understanding   HOME EXERCISE PROGRAM: [id number]  ASSESSMENT:  CLINICAL IMPRESSION: Patient was introduced to multiple new interventions for improved lower extremity stability. She required minimal cueing with BAPS circle to isolate ankle AROM while limiting hip mobility. She experienced no significant pain or discomfort with any of today's interventions. She reported feeling good upon the conclusion of treatment. She continues to require skilled physical therapy to address her remaining impairments to return to her prior level of function.     OBJECTIVE  IMPAIRMENTS Abnormal gait, decreased activity tolerance, decreased balance, decreased mobility, difficulty walking, decreased ROM, decreased strength, hypomobility, increased edema, and pain.   ACTIVITY LIMITATIONS lifting, standing, squatting, stairs, transfers, and locomotion level  PARTICIPATION LIMITATIONS: cleaning, laundry, driving, shopping, community activity, and yard work  PERSONAL FACTORS Transportation are also affecting patient's functional outcome.   REHAB POTENTIAL: Excellent  CLINICAL DECISION MAKING: Stable/uncomplicated  EVALUATION COMPLEXITY: Low   GOALS: Goals reviewed with patient? Yes  SHORT TERM GOALS: Target date: 08/12/2021  Patient will be independent with her initial HEP.  Baseline: Goal status: INITIAL  2.  Patient will be able to demonstrate at least 5 degrees of active right ankle dorsiflexion for improved gait mechanics.  Baseline:  Goal status: INITIAL  LONG TERM GOALS: Target date: 09/02/2021   Patient will be independent with her advanced HEP.  Baseline:  Goal status: INITIAL  2.  Patient will be able to ambulate with no significant gait deviations for improved functional mobility.  Baseline:  Goal status: MET  3.  Patient will be able to navigate at least 4 steps with a reciprocal pattern for improved functional mobility.  Baseline:  Goal status: INITIAL  4.  Patient will be able to walk at least 30 minutes for improved function walking her dogs.  Baseline:  Goal status: INITIAL   PLAN: PT FREQUENCY: 1-2x/week  PT DURATION: 6 weeks  PLANNED INTERVENTIONS: Therapeutic exercises, Therapeutic activity, Neuromuscular re-education, Balance training, Gait training, Patient/Family education, Self Care, Joint mobilization, Stair training, Electrical stimulation, Cryotherapy, Moist heat, Taping, Vasopneumatic device, Contrast bath, Manual therapy, and Re-evaluation  PLAN FOR NEXT SESSION: nustep, BAPS PF/DF and IV/EV, rocker board, and  modalities as needed   Darlin Coco, PT 08/05/2021, 12:33 PM

## 2021-08-11 ENCOUNTER — Ambulatory Visit: Payer: No Typology Code available for payment source

## 2021-08-11 DIAGNOSIS — R262 Difficulty in walking, not elsewhere classified: Secondary | ICD-10-CM

## 2021-08-11 DIAGNOSIS — M25671 Stiffness of right ankle, not elsewhere classified: Secondary | ICD-10-CM | POA: Diagnosis not present

## 2021-08-11 NOTE — Therapy (Signed)
OUTPATIENT PHYSICAL THERAPY LOWER EXTREMITY TREATMENT   Patient Name: Gabrielle Patterson MRN: [id number] DOB:[birth year], 61 y.o., female Today's Date: 08/11/2021   PT End of Session - 08/11/21 0958     Visit Number 4    Number of Visits 12    Date for PT Re-Evaluation 10/01/21    PT Start Time 0945    PT Stop Time 1033    PT Time Calculation (min) 48 min    Activity Tolerance Patient tolerated treatment well    Behavior During Therapy [facility] for tasks assessed/performed                Past Medical History:  Diagnosis Date   Allergic rhinitis    Allergy    COVID    2021, 2022   Diverticulosis    History of cataract removal with insertion of prosthetic lens 2019   Bilateral   Multiple sclerosis ([place])    Urine incontinence    Vitamin D deficiency    Past Surgical History:  Procedure Laterality Date   BLADDER SUSPENSION N/A 08/08/2017   Procedure: TRANSVAGINAL TAPE (TVT) PROCEDURE exact midurethral sling;  Surgeon: Nunzio Cobbs, MD;  Location: [place];  Service: Gynecology;  Laterality: N/A;   BREAST SURGERY  11/08   Rt.breast Bx--Florid fibrocystic changes--high risk but negative for neoplasm   CYSTOSCOPY N/A 08/08/2017   Procedure: CYSTOSCOPY;  Surgeon: Nunzio Cobbs, MD;  Location: [facility];  Service: Gynecology;  Laterality: N/A;   ENDOMETRIAL ABLATION  03-07-05   -HTA   ENDOMETRIAL BIOPSY  12-15-04   --benign endocervical polyp--Dr. 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   TUBAL LIGATION     Patient Active Problem List   Diagnosis Date Noted   Constipation 01/18/2021   Hyperlipidemia 07/31/2017   Class 1 obesity with body mass index (BMI) of 30.0 to 30.9 in adult 07/31/2017   Mixed incontinence 05/02/2017     PCP: Martinique, Betty G, MD  REFERRING PROVIDER: Earlie Server, MD  REFERRING DIAG: ORIF Right Ankle DOS 06/09/21  THERAPY DIAG:  Stiffness of right ankle, not elsewhere classified  Difficulty in walking, not elsewhere classified  Rationale for Evaluation and Treatment Rehabilitation  ONSET DATE: 06/05/21 (initial injury) 6/7 (ORIF)   SUBJECTIVE:   SUBJECTIVE STATEMENT: Patient reports that her ankle is getting better. She notes that her ankle will still swell some if she is doing a lot throughout the day. She feels that she is about 80% of her PLOF.   PERTINENT HISTORY: none  PAIN:  Are you having pain? Yes: NPRS scale: 0/10 Pain location: right ankle  Pain description: tender, aching Aggravating factors: sitting for 1 hour,  Relieving factors: sitting and resting  PRECAUTIONS: None  WEIGHT BEARING RESTRICTIONS Yes WBAT   FALLS:  Has patient fallen in last 6 months? No  LIVING ENVIRONMENT: Lives with: lives with their family Lives in: House/apartment Stairs: Yes: Internal: 20 steps; on right going up and External: 3 steps; on left going up Has following equipment at home: None  NEXT MD FOLLOW UP: 3 weeks  OCCUPATION: computer repair  PLOF: Independent  PATIENT GOALS: return kayaking, tennis, and walk her dogs     OBJECTIVE: all assessments were performed on 7/20 unless otherwise noted  PATIENT SURVEYS:  FOTO 74.29 on 08/11/21  COGNITION:  Overall cognitive status: Within functional limits for tasks assessed     SENSATION: WFL  PALPATION: TTP: right anterior joint line JOINT MOBILITY:   Mild right talocrural joint mobility with no pain  Midfoot and metatarsal joint mobility: WFL and nonpainful   LOWER EXTREMITY ROM:  Active ROM Right eval Right 08/11/21 Left eval  Hip flexion     Hip extension     Hip abduction     Hip adduction     Hip internal rotation     Hip external rotation     Knee flexion     Knee extension     Ankle dorsiflexion  0 8 10  Ankle plantarflexion 24 44 54  Ankle inversion 16 30 40  Ankle eversion -'2 2 8   ' (Blank rows = not tested)  GAIT: Assistive device utilized:  none Level of assistance: Complete Independence   TODAY'S TREATMENT:                                   8/9 EXERCISE LOG  Exercise Repetitions and Resistance Comments  Recumbent bike  L3-4 x 13 minutes   Cybex leg press 1 plate; seat 4 x 2.5 minutes   SLS  On floor; 4 x 30 seconds   Lunges on BOSU  20 reps each   Rocker board DF/PF and lateral 4 minutes each    BOSU circles  3 minutes    Blank cell = exercise not performed today                                    8/3 EXERCISE LOG  Exercise Repetitions and Resistance Comments  Recumbent bike  L3 x 12 minutes   BOSU squats  3 minutes   BOSU marching  3 minutes   BAPS IV/EV and circles  2 minutes each    Heel raises 3 minutes   Lunges 30 reps each    Tandem walking  10 laps    Blank cell = exercise not performed today                                    7/27 EXERCISE LOG  Exercise Repetitions and Resistance Comments  Nustep L4 x 12 minutes   Rocker board 5 minutes   BAPS DF/PF and IV/EV L1 x 2 minutes each   Resisted PF Blue t-band x 20 reps   Tandem balance On foam; 4 x 30 seconds each     Blank cell = exercise not performed today           PATIENT EDUCATION:  Education details: healing, walking Person educated: Patient Education method: Explanation Education comprehension: verbalized understanding   HOME EXERCISE PROGRAM: [id number]  ASSESSMENT:  CLINICAL IMPRESSION: Patient is making excellent progress with skilled physical therapy as evidenced by her subjective reports, objective measures, and progress toward her goals. She was able to meet most of her goals for physical therapy. However, her right ankle AROM continues to be slightly limited compared to the left ankle. She was progressed with multiple new interventions for improved for improved function with her  daily activities. She reported no pain or discomfort with any of today's interventions. Recommend that she continue with  skilled physical therapy to address her remaining impairments to return to her prior level of function.    OBJECTIVE IMPAIRMENTS Abnormal gait, decreased activity tolerance, decreased balance, decreased mobility, difficulty walking, decreased ROM, decreased strength, hypomobility, increased edema, and pain.   ACTIVITY LIMITATIONS lifting, standing, squatting, stairs, transfers, and locomotion level  PARTICIPATION LIMITATIONS: cleaning, laundry, driving, shopping, community activity, and yard work  PERSONAL FACTORS Transportation are also affecting patient's functional outcome.   REHAB POTENTIAL: Excellent  CLINICAL DECISION MAKING: Stable/uncomplicated  EVALUATION COMPLEXITY: Low   GOALS: Goals reviewed with patient? Yes  SHORT TERM GOALS: Target date: 08/12/2021  Patient will be independent with her initial HEP.  Baseline: Goal status: MET  2.  Patient will be able to demonstrate at least 5 degrees of active right ankle dorsiflexion for improved gait mechanics.  Baseline:  Goal status: MET  LONG TERM GOALS: Target date: 09/02/2021   Patient will be independent with her advanced HEP.  Baseline:  Goal status: IN PROGRESS  2.  Patient will be able to ambulate with no significant gait deviations for improved functional mobility.  Baseline:  Goal status: MET  3.  Patient will be able to navigate at least 4 steps with a reciprocal pattern for improved functional mobility.  Baseline:  Goal status: MET  4.  Patient will be able to walk at least 30 minutes for improved function walking her dogs.  Baseline:  Goal status: MET   PLAN: PT FREQUENCY: 1-2x/week  PT DURATION: 6 weeks  PLANNED INTERVENTIONS: Therapeutic exercises, Therapeutic activity, Neuromuscular re-education, Balance training, Gait training, Patient/Family education, Self Care, Joint  mobilization, Stair training, Electrical stimulation, Cryotherapy, Moist heat, Taping, Vasopneumatic device, Contrast bath, Manual therapy, and Re-evaluation  PLAN FOR NEXT SESSION: nustep, BAPS PF/DF and IV/EV, rocker board, and modalities as needed   Darlin Coco, PT 08/11/2021, 12:51 PM

## 2021-08-19 ENCOUNTER — Ambulatory Visit: Payer: No Typology Code available for payment source

## 2021-08-19 DIAGNOSIS — M25671 Stiffness of right ankle, not elsewhere classified: Secondary | ICD-10-CM

## 2021-08-19 DIAGNOSIS — R262 Difficulty in walking, not elsewhere classified: Secondary | ICD-10-CM

## 2021-08-19 NOTE — Therapy (Signed)
OUTPATIENT PHYSICAL THERAPY LOWER EXTREMITY TREATMENT   Patient Name: Elfreida Heggs MRN: [id number] DOB:[birth year], 61 y.o., female Today's Date: 08/19/2021   PT End of Session - 08/19/21 0949     Visit Number 5    Number of Visits 12    Date for PT Re-Evaluation 10/01/21    PT Start Time 0945    PT Stop Time 1030    PT Time Calculation (min) 45 min    Activity Tolerance Patient tolerated treatment well    Behavior During Therapy [facility] for tasks assessed/performed                Past Medical History:  Diagnosis Date   Allergic rhinitis    Allergy    COVID    2021, 2022   Diverticulosis    History of cataract removal with insertion of prosthetic lens 2019   Bilateral   Multiple sclerosis ([place])    Urine incontinence    Vitamin D deficiency    Past Surgical History:  Procedure Laterality Date   BLADDER SUSPENSION N/A 08/08/2017   Procedure: TRANSVAGINAL TAPE (TVT) PROCEDURE exact midurethral sling;  Surgeon: Nunzio Cobbs, MD;  Location: [place];  Service: Gynecology;  Laterality: N/A;   BREAST SURGERY  11/08   Rt.breast Bx--Florid fibrocystic changes--high risk but negative for neoplasm   CYSTOSCOPY N/A 08/08/2017   Procedure: CYSTOSCOPY;  Surgeon: Nunzio Cobbs, MD;  Location: [facility];  Service: Gynecology;  Laterality: N/A;   ENDOMETRIAL ABLATION  03-07-05   -HTA   ENDOMETRIAL BIOPSY  12-15-04   --benign endocervical polyp--Dr. 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   TUBAL LIGATION     Patient Active Problem List   Diagnosis Date Noted   Constipation 01/18/2021   Hyperlipidemia 07/31/2017   Class 1 obesity with body mass index (BMI) of 30.0 to 30.9 in adult 07/31/2017   Mixed incontinence 05/02/2017     PCP: [place], Betty G, MD  REFERRING PROVIDER: Earlie Server, MD  REFERRING DIAG: ORIF Right Ankle DOS 06/09/21  THERAPY DIAG:  Stiffness of right ankle, not elsewhere classified  Difficulty in walking, not elsewhere classified  Rationale for Evaluation and Treatment Rehabilitation  ONSET DATE: 06/05/21 (initial injury) 6/7 (ORIF)   SUBJECTIVE:   SUBJECTIVE STATEMENT: Pt reports minimal right ankle soreness today, but has spent a lot of time on her feet the past few days.  PERTINENT HISTORY: none  PAIN:  Are you having pain? Yes: NPRS scale: 2/10 Pain location: right ankle  Pain description: tender, aching Aggravating factors: sitting for 1 hour,  Relieving factors: sitting and resting  PRECAUTIONS: None  WEIGHT BEARING RESTRICTIONS Yes WBAT   FALLS:  Has patient fallen in last 6 months? No  LIVING ENVIRONMENT: Lives with: lives with their family Lives in: House/apartment Stairs: Yes: Internal: 20 steps; on right going up and External: 3 steps; on left going up Has following equipment at home: None  NEXT MD FOLLOW UP: 3 weeks  OCCUPATION: computer repair  PLOF: Independent  PATIENT GOALS: return kayaking, tennis, and walk her dogs     OBJECTIVE: all assessments were performed on 7/20 unless otherwise noted  PATIENT SURVEYS:  FOTO 74.29 on 08/11/21  COGNITION:  Overall cognitive status: Within functional limits  for tasks assessed     SENSATION: WFL  PALPATION: TTP: right anterior joint line JOINT MOBILITY:   Mild right talocrural joint mobility with no pain  Midfoot and metatarsal joint mobility: WFL and nonpainful   LOWER EXTREMITY ROM:  Active ROM Right eval Right 08/11/21 Left eval  Hip flexion     Hip extension     Hip abduction     Hip adduction     Hip internal rotation     Hip external rotation     Knee flexion     Knee extension     Ankle dorsiflexion 0 8 10  Ankle plantarflexion 24 44 54  Ankle inversion 16 30 40  Ankle  eversion -'2 2 8   ' (Blank rows = not tested)  GAIT: Assistive device utilized:  none Level of assistance: Complete Independence   TODAY'S TREATMENT:     8/17 EXERCISE LOG  Exercise Repetitions and Resistance Comments  Recumbent bike  L4 x 18 minutes   Cybex leg press 1 plate; seat 7 x 2 minutes; RLE only   Cybex leg press with heel raise 1 plate; x 2 mins   SLS Cone Pick up 2 reps x 5 cone   SLS  Rebounder 2 sets of 15   Lunges on BOSU  20 reps each   Rocker board DF/PF and lateral 4 minutes each    BOSU circles      Blank cell = exercise not performed today                                     8/9 EXERCISE LOG  Exercise Repetitions and Resistance Comments  Recumbent bike  L3-4 x 13 minutes   Cybex leg press 1 plate; seat 4 x 2.5 minutes   SLS  On floor; 4 x 30 seconds   Lunges on BOSU  20 reps each   Rocker board DF/PF and lateral 4 minutes each    BOSU circles  3 minutes    Blank cell = exercise not performed today                                                           PATIENT EDUCATION:  Education details: healing, walking Person educated: Patient Education method: Explanation Education comprehension: verbalized understanding   HOME EXERCISE PROGRAM: [id number]  ASSESSMENT:  CLINICAL IMPRESSION: Pt arrives for today's treatment session reporting 2/10 aright ankle soreness.  Pt attributes to being on her feet canning vegetables the past few days.  Pt introduced to single leg balance activities today.  Pt challenged by all newly added SLS balance exercises.  Pt denied any pain upon completion of today's treatment session.   OBJECTIVE IMPAIRMENTS Abnormal gait, decreased activity tolerance, decreased balance, decreased mobility, difficulty walking, decreased ROM, decreased strength, hypomobility, increased edema, and pain.   ACTIVITY LIMITATIONS lifting, standing, squatting, stairs, transfers, and locomotion level  PARTICIPATION LIMITATIONS: cleaning, laundry,  driving, shopping, community activity, and yard work  PERSONAL FACTORS Transportation are also affecting patient's functional outcome.   REHAB POTENTIAL: Excellent  CLINICAL DECISION MAKING: Stable/uncomplicated  EVALUATION COMPLEXITY: Low   GOALS: Goals reviewed with patient? Yes  SHORT TERM GOALS: Target date: 08/12/2021  Patient will be independent with  her initial HEP.  Baseline: Goal status: MET  2.  Patient will be able to demonstrate at least 5 degrees of active right ankle dorsiflexion for improved gait mechanics.  Baseline:  Goal status: MET  LONG TERM GOALS: Target date: 09/02/2021   Patient will be independent with her advanced HEP.  Baseline:  Goal status: IN PROGRESS  2.  Patient will be able to ambulate with no significant gait deviations for improved functional mobility.  Baseline:  Goal status: MET  3.  Patient will be able to navigate at least 4 steps with a reciprocal pattern for improved functional mobility.  Baseline:  Goal status: MET  4.  Patient will be able to walk at least 30 minutes for improved function walking her dogs.  Baseline:  Goal status: MET   PLAN: PT FREQUENCY: 1-2x/week  PT DURATION: 6 weeks  PLANNED INTERVENTIONS: Therapeutic exercises, Therapeutic activity, Neuromuscular re-education, Balance training, Gait training, Patient/Family education, Self Care, Joint mobilization, Stair training, Electrical stimulation, Cryotherapy, Moist heat, Taping, Vasopneumatic device, Contrast bath, Manual therapy, and Re-evaluation  PLAN FOR NEXT SESSION: nustep, BAPS PF/DF and IV/EV, rocker board, and modalities as needed   Kathrynn Ducking, PTA 08/19/2021, 10:42 AM

## 2021-08-25 ENCOUNTER — Ambulatory Visit: Payer: No Typology Code available for payment source

## 2021-08-25 DIAGNOSIS — R262 Difficulty in walking, not elsewhere classified: Secondary | ICD-10-CM

## 2021-08-25 DIAGNOSIS — M25671 Stiffness of right ankle, not elsewhere classified: Secondary | ICD-10-CM | POA: Diagnosis not present

## 2021-08-25 NOTE — Therapy (Signed)
OUTPATIENT PHYSICAL THERAPY LOWER EXTREMITY TREATMENT   Patient Name: Gabrielle Patterson MRN: [id number] DOB:[birth year], 61 y.o., female Today's Date: 08/25/2021   PT End of Session - 08/25/21 0953     Visit Number 6    Number of Visits 12    Date for PT Re-Evaluation 10/01/21    PT Start Time 0951    PT Stop Time 1030    PT Time Calculation (min) 39 min    Activity Tolerance Patient tolerated treatment well    Behavior During Therapy [facility] for tasks assessed/performed                 Past Medical History:  Diagnosis Date   Allergic rhinitis    Allergy    COVID    2021, 2022   Diverticulosis    History of cataract removal with insertion of prosthetic lens 2019   Bilateral   Multiple sclerosis ([place])    Urine incontinence    Vitamin D deficiency    Past Surgical History:  Procedure Laterality Date   BLADDER SUSPENSION N/A 08/08/2017   Procedure: TRANSVAGINAL TAPE (TVT) PROCEDURE exact midurethral sling;  Surgeon: Nunzio Cobbs, MD;  Location: [place];  Service: Gynecology;  Laterality: N/A;   BREAST SURGERY  11/08   Rt.breast Bx--Florid fibrocystic changes--high risk but negative for neoplasm   CYSTOSCOPY N/A 08/08/2017   Procedure: CYSTOSCOPY;  Surgeon: Nunzio Cobbs, MD;  Location: [facility];  Service: Gynecology;  Laterality: N/A;   ENDOMETRIAL ABLATION  03-07-05   -HTA   ENDOMETRIAL BIOPSY  12-15-04   --benign endocervical polyp--Dr. 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   TUBAL LIGATION     Patient Active Problem List   Diagnosis Date Noted   Constipation 01/18/2021   Hyperlipidemia 07/31/2017   Class 1 obesity with body mass index (BMI) of 30.0 to 30.9 in adult 07/31/2017   Mixed incontinence 05/02/2017     PCP: Martinique, Betty G, MD  REFERRING PROVIDER: Earlie Server, MD  REFERRING DIAG: ORIF Right Ankle DOS 06/09/21  THERAPY DIAG:  Stiffness of right ankle, not elsewhere classified  Difficulty in walking, not elsewhere classified  Rationale for Evaluation and Treatment Rehabilitation  ONSET DATE: 06/05/21 (initial injury) 6/7 (ORIF)   SUBJECTIVE:   SUBJECTIVE STATEMENT: Patient reports that she over did it yesterday. She notes that her whole right leg is sore. She notes that she was going up and down her steps at home about 3 times yesterday with laundry. She has noticed that she is getting better. She also mentioned that she was able to run across her house since her last appointment and noted that it felt good.   PERTINENT HISTORY: none  PAIN:  Are you having pain? Yes: NPRS scale: 2.5-3/10 Pain location: right ankle  Pain description: tender, aching Aggravating factors: sitting for 1 hour,  Relieving factors: sitting and resting  PRECAUTIONS: None  WEIGHT BEARING RESTRICTIONS Yes WBAT   FALLS:  Has patient fallen in last 6 months? No  LIVING ENVIRONMENT: Lives with: lives with their family Lives in: House/apartment Stairs: Yes: Internal: 20 steps; on right going up and External: 3 steps; on left going up Has following equipment at home: None  NEXT MD FOLLOW UP: 3 weeks  OCCUPATION: Nurse, learning disability  PLOF: Independent  PATIENT GOALS: return kayaking, tennis, and walk her dogs     OBJECTIVE: all assessments were performed on 7/20 unless otherwise noted  PATIENT SURVEYS:  FOTO 74.29 on 08/11/21  COGNITION:  Overall cognitive status: Within functional limits for tasks assessed     SENSATION: WFL  PALPATION: TTP: right anterior joint line JOINT MOBILITY:   Mild right talocrural joint mobility with no pain  Midfoot and metatarsal joint mobility: WFL and nonpainful   LOWER EXTREMITY ROM:  Active ROM Right eval Right 08/11/21 Left eval  Hip flexion      Hip extension     Hip abduction     Hip adduction     Hip internal rotation     Hip external rotation     Knee flexion     Knee extension     Ankle dorsiflexion 0 8 10  Ankle plantarflexion 24 44 54  Ankle inversion 16 30 40  Ankle eversion -'2 2 8   ' (Blank rows = not tested)  GAIT: Assistive device utilized:  none Level of assistance: Complete Independence   TODAY'S TREATMENT:                                   8/23 EXERCISE LOG  Exercise Repetitions and Resistance Comments  Elliptical  R2/L2 x 9 minutes   Rocker board AP and lateral x 3 minutes each   BOSU squats Ball down x 20 reps    SLS on airex 4 x 30 seconds each   Gastroc/ soleus stretch  4 x 30 seconds   SL calf raise  20 reps each     Blank cell = exercise not performed today      8/17 EXERCISE LOG  Exercise Repetitions and Resistance Comments  Recumbent bike  L4 x 18 minutes   Cybex leg press 1 plate; seat 7 x 2 minutes; RLE only   Cybex leg press with heel raise 1 plate; x 2 mins   SLS Cone Pick up 2 reps x 5 cone   SLS  Rebounder 2 sets of 15   Lunges on BOSU  20 reps each   Rocker board DF/PF and lateral 4 minutes each    BOSU circles      Blank cell = exercise not performed today                                     8/9 EXERCISE LOG  Exercise Repetitions and Resistance Comments  Recumbent bike  L3-4 x 13 minutes   Cybex leg press 1 plate; seat 4 x 2.5 minutes   SLS  On floor; 4 x 30 seconds   Lunges on BOSU  20 reps each   Rocker board DF/PF and lateral 4 minutes each    BOSU circles  3 minutes    Blank cell = exercise not performed today                                                           PATIENT EDUCATION:  Education details: healing, walking Person educated: Patient Education method: Explanation Education comprehension: verbalized understanding  HOME EXERCISE PROGRAM: [id number]  ASSESSMENT:  CLINICAL IMPRESSION: Patient presented with increased right lower extremity  soreness secondary to doing her laundry and navigating her basement steps yesterday. She was introduced to multiple new interventions for improved ankle mobility and stability. She required minimal cueing with BOSU squats for reduced trunk flexion to facilitate proper biomechanics. She reported feeling a little sore upon the conclusion of treatment. She continues to require skilled physical therapy to address her remaining impairments to return to her prior level of function.    OBJECTIVE IMPAIRMENTS Abnormal gait, decreased activity tolerance, decreased balance, decreased mobility, difficulty walking, decreased ROM, decreased strength, hypomobility, increased edema, and pain.   ACTIVITY LIMITATIONS lifting, standing, squatting, stairs, transfers, and locomotion level  PARTICIPATION LIMITATIONS: cleaning, laundry, driving, shopping, community activity, and yard work  PERSONAL FACTORS Transportation are also affecting patient's functional outcome.   REHAB POTENTIAL: Excellent  CLINICAL DECISION MAKING: Stable/uncomplicated  EVALUATION COMPLEXITY: Low   GOALS: Goals reviewed with patient? Yes  SHORT TERM GOALS: Target date: 08/12/2021  Patient will be independent with her initial HEP.  Baseline: Goal status: MET  2.  Patient will be able to demonstrate at least 5 degrees of active right ankle dorsiflexion for improved gait mechanics.  Baseline:  Goal status: MET  LONG TERM GOALS: Target date: 09/02/2021   Patient will be independent with her advanced HEP.  Baseline:  Goal status: IN PROGRESS  2.  Patient will be able to ambulate with no significant gait deviations for improved functional mobility.  Baseline:  Goal status: MET  3.  Patient will be able to navigate at least 4 steps with a reciprocal pattern for improved functional mobility.  Baseline:  Goal status: MET  4.  Patient will be able to walk at least 30 minutes for improved function walking her dogs.  Baseline:   Goal status: MET   PLAN: PT FREQUENCY: 1-2x/week  PT DURATION: 6 weeks  PLANNED INTERVENTIONS: Therapeutic exercises, Therapeutic activity, Neuromuscular re-education, Balance training, Gait training, Patient/Family education, Self Care, Joint mobilization, Stair training, Electrical stimulation, Cryotherapy, Moist heat, Taping, Vasopneumatic device, Contrast bath, Manual therapy, and Re-evaluation  PLAN FOR NEXT SESSION: nustep, BAPS PF/DF and IV/EV, rocker board, and modalities as needed   Darlin Coco, PT 08/25/2021, 12:26 PM

## 2021-09-01 ENCOUNTER — Ambulatory Visit: Payer: No Typology Code available for payment source

## 2021-09-01 DIAGNOSIS — M25671 Stiffness of right ankle, not elsewhere classified: Secondary | ICD-10-CM

## 2021-09-01 DIAGNOSIS — R262 Difficulty in walking, not elsewhere classified: Secondary | ICD-10-CM

## 2021-09-01 NOTE — Therapy (Signed)
OUTPATIENT PHYSICAL THERAPY LOWER EXTREMITY TREATMENT   Patient Name: Chandelle Harkey MRN: [id number] DOB:01-02-1961, 61 y.o., female Today's Date: 09/01/2021   PT End of Session - 09/01/21 0954     Visit Number 7    Number of Visits 12    Date for PT Re-Evaluation 10/01/21    PT Start Time 0949    PT Stop Time 7829    PT Time Calculation (min) 56 min    Activity Tolerance Patient tolerated treatment well    Behavior During Therapy [facility] for tasks assessed/performed                  Past Medical History:  Diagnosis Date   Allergic rhinitis    Allergy    COVID    2021, 2022   Diverticulosis    History of cataract removal with insertion of prosthetic lens 2019   Bilateral   Multiple sclerosis ([place])    Urine incontinence    Vitamin D deficiency    Past Surgical History:  Procedure Laterality Date   BLADDER SUSPENSION N/A 08/08/2017   Procedure: TRANSVAGINAL TAPE (TVT) PROCEDURE exact midurethral sling;  Surgeon: Nunzio Cobbs, MD;  Location: [place];  Service: Gynecology;  Laterality: N/A;   BREAST SURGERY  11/08   Rt.breast Bx--Florid fibrocystic changes--high risk but negative for neoplasm   CYSTOSCOPY N/A 08/08/2017   Procedure: CYSTOSCOPY;  Surgeon: Nunzio Cobbs, MD;  Location: [facility];  Service: Gynecology;  Laterality: N/A;   ENDOMETRIAL ABLATION  03-07-05   -HTA   ENDOMETRIAL BIOPSY  12-15-04   --benign endocervical polyp--Dr. 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   TUBAL LIGATION     Patient Active Problem List   Diagnosis Date Noted   Constipation 01/18/2021   Hyperlipidemia 07/31/2017   Class 1 obesity with body mass index (BMI) of 30.0 to 30.9 in adult 07/31/2017   Mixed incontinence 05/02/2017     PCP: Martinique, Betty G, MD  REFERRING PROVIDER: Earlie Server, MD  REFERRING DIAG: ORIF Right Ankle DOS 06/09/21  THERAPY DIAG:  Stiffness of right ankle, not elsewhere classified  Difficulty in walking, not elsewhere classified  Rationale for Evaluation and Treatment Rehabilitation  ONSET DATE: 06/05/21 (initial injury) 6/7 (ORIF)   SUBJECTIVE:   SUBJECTIVE STATEMENT: Patient reports that her ankle was really swollen and sore for two days since her last appointment. She notes that her ankle will still swell some throughout the day.   PERTINENT HISTORY: none  PAIN:  Are you having pain? Yes: NPRS scale: 2-2.5/10 Pain location: right ankle  Pain description: tender, aching Aggravating factors: sitting for 1 hour,  Relieving factors: sitting and resting  PRECAUTIONS: None  WEIGHT BEARING RESTRICTIONS Yes WBAT   FALLS:  Has patient fallen in last 6 months? No  LIVING ENVIRONMENT: Lives with: lives with their family Lives in: House/apartment Stairs: Yes: Internal: 20 steps; on right going up and External: 3 steps; on left going up Has following equipment at home: None  NEXT MD FOLLOW UP: 3 weeks  OCCUPATION: computer repair  PLOF: Independent  PATIENT GOALS: return kayaking, tennis, and walk her dogs     OBJECTIVE: all assessments were performed on 7/20 unless otherwise noted  PATIENT SURVEYS:  FOTO 74.29  on 08/11/21  COGNITION:  Overall cognitive status: Within functional limits for tasks assessed     SENSATION: WFL  PALPATION: TTP: right anterior joint line JOINT MOBILITY:   Mild right talocrural joint mobility with no pain  Midfoot and metatarsal joint mobility: WFL and nonpainful   LOWER EXTREMITY ROM:  Active ROM Right eval Right 08/11/21 Left eval  Hip flexion     Hip extension     Hip abduction     Hip adduction     Hip internal rotation     Hip external rotation     Knee flexion     Knee extension     Ankle dorsiflexion 0 8 10   Ankle plantarflexion 24 44 54  Ankle inversion 16 30 40  Ankle eversion -'2 2 8   ' (Blank rows = not tested)  GAIT: Assistive device utilized:  none Level of assistance: Complete Independence   TODAY'S TREATMENT:                                   8/30 EXERCISE LOG  Exercise Repetitions and Resistance Comments  Recumbent bike L3 x 15 minutes   Rocker board 4 minutes each DF/PF and lateral; intermittent UE support  Marching on BOSU  Ball up; 3 minutes   Heel raises 3 minutes Toes on foam pad  Leg press 1 plate; seat 6 x 3 minutes    Blank cell = exercise not performed today  Modalities  Date:  Vaso: Ankle, 34 degrees; medium pressure, 34 degrees; medium pressure mins, Pain and Edema                                   8/23 EXERCISE LOG  Exercise Repetitions and Resistance Comments  Elliptical  R2/L2 x 9 minutes   Rocker board AP and lateral x 3 minutes each   BOSU squats Ball down x 20 reps    SLS on airex 4 x 30 seconds each   Gastroc/ soleus stretch  4 x 30 seconds   SL calf raise  20 reps each     Blank cell = exercise not performed today      8/17 EXERCISE LOG  Exercise Repetitions and Resistance Comments  Recumbent bike  L4 x 18 minutes   Cybex leg press 1 plate; seat 7 x 2 minutes; RLE only   Cybex leg press with heel raise 1 plate; x 2 mins   SLS Cone Pick up 2 reps x 5 cone   SLS  Rebounder 2 sets of 15   Lunges on BOSU  20 reps each   Rocker board DF/PF and lateral 4 minutes each    BOSU circles      Blank cell = exercise not performed today                                                        PATIENT EDUCATION:  Education details: swelling Person educated: Patient Education method: Explanation Education comprehension: verbalized understanding   HOME EXERCISE PROGRAM: [id number]  ASSESSMENT:  CLINICAL IMPRESSION: Treatment focused on familiar interventions due to patient reported swelling and pain after her last appointment. She required minimal  cueing  with today's interventions for proper exercise performance. She experienced no significant pain or discomfort with today's interventions. She reported feeling good as her ankle was not hurting upon the conclusion of treatment. She continues to require skilled physical therapy to address her remaining impairments to return to her prior level of function.    OBJECTIVE IMPAIRMENTS Abnormal gait, decreased activity tolerance, decreased balance, decreased mobility, difficulty walking, decreased ROM, decreased strength, hypomobility, increased edema, and pain.   ACTIVITY LIMITATIONS lifting, standing, squatting, stairs, transfers, and locomotion level  PARTICIPATION LIMITATIONS: cleaning, laundry, driving, shopping, community activity, and yard work  PERSONAL FACTORS Transportation are also affecting patient's functional outcome.   REHAB POTENTIAL: Excellent  CLINICAL DECISION MAKING: Stable/uncomplicated  EVALUATION COMPLEXITY: Low   GOALS: Goals reviewed with patient? Yes  SHORT TERM GOALS: Target date: 08/12/2021  Patient will be independent with her initial HEP.  Baseline: Goal status: MET  2.  Patient will be able to demonstrate at least 5 degrees of active right ankle dorsiflexion for improved gait mechanics.  Baseline:  Goal status: MET  LONG TERM GOALS: Target date: 09/02/2021   Patient will be independent with her advanced HEP.  Baseline:  Goal status: IN PROGRESS  2.  Patient will be able to ambulate with no significant gait deviations for improved functional mobility.  Baseline:  Goal status: MET  3.  Patient will be able to navigate at least 4 steps with a reciprocal pattern for improved functional mobility.  Baseline:  Goal status: MET  4.  Patient will be able to walk at least 30 minutes for improved function walking her dogs.  Baseline:  Goal status: MET   PLAN: PT FREQUENCY: 1-2x/week  PT DURATION: 6 weeks  PLANNED INTERVENTIONS: Therapeutic  exercises, Therapeutic activity, Neuromuscular re-education, Balance training, Gait training, Patient/Family education, Self Care, Joint mobilization, Stair training, Electrical stimulation, Cryotherapy, Moist heat, Taping, Vasopneumatic device, Contrast bath, Manual therapy, and Re-evaluation  PLAN FOR NEXT SESSION: nustep, BAPS PF/DF and IV/EV, rocker board, and modalities as needed   Darlin Coco, PT 09/01/2021, 12:25 PM

## 2021-09-08 ENCOUNTER — Ambulatory Visit: Payer: No Typology Code available for payment source | Attending: Family Medicine

## 2021-09-08 DIAGNOSIS — M25671 Stiffness of right ankle, not elsewhere classified: Secondary | ICD-10-CM | POA: Insufficient documentation

## 2021-09-08 DIAGNOSIS — R262 Difficulty in walking, not elsewhere classified: Secondary | ICD-10-CM | POA: Diagnosis present

## 2021-09-08 NOTE — Therapy (Signed)
OUTPATIENT PHYSICAL THERAPY LOWER EXTREMITY TREATMENT   Patient Name: Gabrielle Patterson MRN: [id number] DOB:1960-09-11, 61 y.o., female Today's Date: 09/08/2021   PT End of Session - 09/08/21 0947     Visit Number 8    Number of Visits 12    Date for PT Re-Evaluation 10/01/21    PT Start Time 0945    PT Stop Time 9562    PT Time Calculation (min) 59 min    Activity Tolerance Patient tolerated treatment well    Behavior During Therapy [facility] for tasks assessed/performed                  Past Medical History:  Diagnosis Date   Allergic rhinitis    Allergy    COVID    2021, 2022   Diverticulosis    History of cataract removal with insertion of prosthetic lens 2019   Bilateral   Multiple sclerosis ([place])    Urine incontinence    Vitamin D deficiency    Past Surgical History:  Procedure Laterality Date   BLADDER SUSPENSION N/A 08/08/2017   Procedure: TRANSVAGINAL TAPE (TVT) PROCEDURE exact midurethral sling;  Surgeon: Nunzio Cobbs, MD;  Location: [place];  Service: Gynecology;  Laterality: N/A;   BREAST SURGERY  11/08   Rt.breast Bx--Florid fibrocystic changes--high risk but negative for neoplasm   CYSTOSCOPY N/A 08/08/2017   Procedure: CYSTOSCOPY;  Surgeon: Nunzio Cobbs, MD;  Location: [facility];  Service: Gynecology;  Laterality: N/A;   ENDOMETRIAL ABLATION  03-07-05   -HTA   ENDOMETRIAL BIOPSY  12-15-04   --benign endocervical polyp--Dr. 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   TUBAL LIGATION     Patient Active Problem List   Diagnosis Date Noted   Constipation 01/18/2021   Hyperlipidemia 07/31/2017   Class 1 obesity with body mass index (BMI) of 30.0 to 30.9 in adult 07/31/2017   Mixed incontinence 05/02/2017     PCP: [place], Betty G, MD  REFERRING PROVIDER: Earlie Server, MD  REFERRING DIAG: ORIF Right Ankle DOS 06/09/21  THERAPY DIAG:  Stiffness of right ankle, not elsewhere classified  Difficulty in walking, not elsewhere classified  Rationale for Evaluation and Treatment Rehabilitation  ONSET DATE: 06/05/21 (initial injury) 6/7 (ORIF)   SUBJECTIVE:   SUBJECTIVE STATEMENT: Pt reports going to the flea market this weekend and did not have any swelling afterward.    PERTINENT HISTORY: none  PAIN:  Are you having pain? No  PRECAUTIONS: None  WEIGHT BEARING RESTRICTIONS Yes WBAT   FALLS:  Has patient fallen in last 6 months? No  LIVING ENVIRONMENT: Lives with: lives with their family Lives in: House/apartment Stairs: Yes: Internal: 20 steps; on right going up and External: 3 steps; on left going up Has following equipment at home: None  NEXT MD FOLLOW UP: 3 weeks  OCCUPATION: computer repair  PLOF: Independent  PATIENT GOALS: return kayaking, tennis, and walk her dogs     OBJECTIVE: all assessments were performed on 7/20 unless otherwise noted  PATIENT SURVEYS:  FOTO 74.29 on 08/11/21  COGNITION:  Overall cognitive status: Within functional limits for tasks assessed     SENSATION: WFL  PALPATION: TTP: right anterior joint line JOINT MOBILITY:   Mild right talocrural joint mobility  with no pain  Midfoot and metatarsal joint mobility: WFL and nonpainful   LOWER EXTREMITY ROM:  Active ROM Right eval Right 08/11/21 Left eval  Hip flexion     Hip extension     Hip abduction     Hip adduction     Hip internal rotation     Hip external rotation     Knee flexion     Knee extension     Ankle dorsiflexion 0 8 10  Ankle plantarflexion 24 44 54  Ankle inversion 16 30 40  Ankle eversion -_0 (Blank rows = not tested)  GAIT: Assistive device utilized:  none Level of assistance: Complete Independence   TODAY'S TREATMENT:                                    9/5 EXERCISE LOG  Exercise Repetitions and Resistance Comments  Recumbent bike L4 x 20 minutes   Rocker board 5 minutes each DF/PF and lateral; intermittent UE support  SLS Ball Throw/catch 2# x 2 mins   Heel raises 1.5 minutes RLE, 1.5 BLE   STS SLS X 15 reps   Leg press 1 plate; seat 6 x 3 minutes    Blank cell = exercise not performed today  Modalities  Date:  Vaso: Ankle, 34 degrees; medium pressure, 34 degrees; medium pressure mins, Pain and Edema                                                                                    PATIENT EDUCATION:  Education details: swelling Person educated: Patient Education method: Explanation Education comprehension: verbalized understanding   HOME EXERCISE PROGRAM: [id number]  ASSESSMENT:  CLINICAL IMPRESSION: Pt arrives for today's treatment session denying any pain.  Pt reports that she was able to go to the flea market this weekend and did not have any swelling only mild soreness.  Pt challenged by single leg activities, but able to perform all exercises and reps asked of her.  Normal responses to vaso noted upon removal.  Pt denied any pain at completion of today's treatment session.    OBJECTIVE IMPAIRMENTS Abnormal gait, decreased activity tolerance, decreased balance, decreased mobility, difficulty walking, decreased ROM, decreased strength, hypomobility, increased edema, and pain.   ACTIVITY LIMITATIONS lifting, standing, squatting, stairs, transfers, and locomotion level  PARTICIPATION LIMITATIONS: cleaning, laundry, driving, shopping, community activity, and yard work  PERSONAL FACTORS Transportation are also affecting patient's functional outcome.   REHAB POTENTIAL: Excellent  CLINICAL DECISION MAKING: Stable/uncomplicated  EVALUATION COMPLEXITY: Low   GOALS: Goals reviewed with patient? Yes  SHORT TERM GOALS: Target date: 08/12/2021  Patient will be independent with her initial HEP.  Baseline: Goal  status: MET  2.  Patient will be able to demonstrate at least 5 degrees of active right ankle dorsiflexion for improved gait mechanics.  Baseline:  Goal status: MET  LONG TERM GOALS: Target date: 09/02/2021   Patient will be independent with her advanced HEP.  Baseline:  Goal status: IN PROGRESS  2.  Patient will be able to ambulate with no significant gait deviations  for improved functional mobility.  Baseline:  Goal status: MET  3.  Patient will be able to navigate at least 4 steps with a reciprocal pattern for improved functional mobility.  Baseline:  Goal status: MET  4.  Patient will be able to walk at least 30 minutes for improved function walking her dogs.  Baseline:  Goal status: MET   PLAN: PT FREQUENCY: 1-2x/week  PT DURATION: 6 weeks  PLANNED INTERVENTIONS: Therapeutic exercises, Therapeutic activity, Neuromuscular re-education, Balance training, Gait training, Patient/Family education, Self Care, Joint mobilization, Stair training, Electrical stimulation, Cryotherapy, Moist heat, Taping, Vasopneumatic device, Contrast bath, Manual therapy, and Re-evaluation  PLAN FOR NEXT SESSION: nustep, BAPS PF/DF and IV/EV, rocker board, and modalities as needed   Kathrynn Ducking, PTA 09/08/2021, 10:50 AM

## 2021-09-15 ENCOUNTER — Ambulatory Visit: Payer: No Typology Code available for payment source

## 2021-09-15 DIAGNOSIS — M25671 Stiffness of right ankle, not elsewhere classified: Secondary | ICD-10-CM

## 2021-09-15 DIAGNOSIS — R262 Difficulty in walking, not elsewhere classified: Secondary | ICD-10-CM

## 2021-09-15 NOTE — Therapy (Signed)
OUTPATIENT PHYSICAL THERAPY LOWER EXTREMITY TREATMENT   Patient Name: Gabrielle Patterson MRN: [id number] DOB:1960/08/30, 61 y.o., female Today's Date: 09/15/2021   PT End of Session - 09/15/21 1047     Visit Number 9    Number of Visits 12    Date for PT Re-Evaluation 10/01/21    PT Start Time 1030    PT Stop Time 8338    PT Time Calculation (min) 64 min    Activity Tolerance Patient tolerated treatment well    Behavior During Therapy [facility] for tasks assessed/performed                   Past Medical History:  Diagnosis Date   Allergic rhinitis    Allergy    COVID    2021, 2022   Diverticulosis    History of cataract removal with insertion of prosthetic lens 2019   Bilateral   Multiple sclerosis ([place])    Urine incontinence    Vitamin D deficiency    Past Surgical History:  Procedure Laterality Date   BLADDER SUSPENSION N/A 08/08/2017   Procedure: TRANSVAGINAL TAPE (TVT) PROCEDURE exact midurethral sling;  Surgeon: Nunzio Cobbs, MD;  Location: [facility];  Service: Gynecology;  Laterality: N/A;   BREAST SURGERY  11/08   Rt.breast Bx--Florid fibrocystic changes--high risk but negative for neoplasm   CYSTOSCOPY N/A 08/08/2017   Procedure: CYSTOSCOPY;  Surgeon: Nunzio Cobbs, MD;  Location: [facility];  Service: Gynecology;  Laterality: N/A;   ENDOMETRIAL ABLATION  03-07-05   -HTA   ENDOMETRIAL BIOPSY  12-15-04   --benign endocervical polyp--Dr. 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   TUBAL LIGATION     Patient Active Problem List   Diagnosis Date Noted   Constipation 01/18/2021   Hyperlipidemia 07/31/2017   Class 1 obesity with body mass index (BMI) of 30.0 to 30.9 in adult 07/31/2017   Mixed incontinence  05/02/2017    PCP: Martinique, Betty G, MD  REFERRING PROVIDER: Earlie Server, MD  REFERRING DIAG: ORIF Right Ankle DOS 06/09/21  THERAPY DIAG:  Stiffness of right ankle, not elsewhere classified  Difficulty in walking, not elsewhere classified  Rationale for Evaluation and Treatment Rehabilitation  ONSET DATE: 06/05/21 (initial injury) 6/7 (ORIF)   SUBJECTIVE:   SUBJECTIVE STATEMENT: Patient reports that she feels comfortable being discharged today.   PERTINENT HISTORY: none  PAIN:  Are you having pain? No  PRECAUTIONS: None  WEIGHT BEARING RESTRICTIONS Yes WBAT   FALLS:  Has patient fallen in last 6 months? No  LIVING ENVIRONMENT: Lives with: lives with their family Lives in: House/apartment Stairs: Yes: Internal: 20 steps; on right going up and External: 3 steps; on left going up Has following equipment at home: None  NEXT MD FOLLOW UP: 3 weeks  OCCUPATION: computer repair  PLOF: Independent  PATIENT GOALS: return kayaking, tennis, and walk her dogs     OBJECTIVE: all assessments were performed on 7/20 unless otherwise noted  PATIENT SURVEYS:  FOTO 82.95 on 09/15/21  COGNITION:  Overall cognitive status: Within functional limits for tasks assessed     SENSATION: WFL  PALPATION: TTP: right anterior joint line JOINT MOBILITY:   Mild right talocrural joint mobility with no pain  Midfoot and metatarsal  joint mobility: WFL and nonpainful   LOWER EXTREMITY ROM:  Active ROM Right eval Right 08/11/21 Right 09/15/21 Left eval  Hip flexion      Hip extension      Hip abduction      Hip adduction      Hip internal rotation      Hip external rotation      Knee flexion      Knee extension      Ankle dorsiflexion 0 '8 12 10  ' Ankle plantarflexion 24 44 50 54  Ankle inversion 16 30 35 40  Ankle eversion -'2 2 15 8   ' (Blank rows = not tested)  GAIT: Assistive device utilized:  none Level of assistance: Complete Independence   TODAY'S TREATMENT:                                    9/13 EXERCISE LOG  Exercise Repetitions and Resistance Comments  Recumbent bike L4 x 20 minutes   Single leg heel raise 1.5 minutes   Rocker board 5 minutes    SLS on dyno disc  4 x 30 seconds    BOSU squat  Ball down; 2 minutes    BOSU marching  3 minutes    Blank cell = exercise not performed today  Modalities  Date:  Vaso: Ankle, 34 degrees; low pressure, 10 mins, Pain                                   9/5 EXERCISE LOG  Exercise Repetitions and Resistance Comments  Recumbent bike L4 x 20 minutes   Rocker board 5 minutes each DF/PF and lateral; intermittent UE support  SLS Ball Throw/catch 2# x 2 mins   Heel raises 1.5 minutes RLE, 1.5 BLE   STS SLS X 15 reps   Leg press 1 plate; seat 6 x 3 minutes    Blank cell = exercise not performed today  Modalities  Date:  Vaso: Ankle, 34 degrees; medium pressure, 34 degrees; medium pressure mins, Pain and Edema                                                                                    PATIENT EDUCATION:  Education details: swelling Person educated: Patient Education method: Explanation Education comprehension: verbalized understanding   HOME EXERCISE PROGRAM: [id number]  ASSESSMENT:  CLINICAL IMPRESSION: Patient has met all of her goals for skilled physical therapy and feels comfortable being discharged at this time. Her HEP was reviewed and she reported feeling comfortable with these interventions and she was able to properly demonstrate these interventions. She reported feeling good upon the conclusion of treatment and felt comfortable being discharged at this time.    OBJECTIVE IMPAIRMENTS Abnormal gait, decreased activity tolerance, decreased balance, decreased mobility, difficulty walking, decreased ROM, decreased strength, hypomobility, increased edema, and pain.   ACTIVITY LIMITATIONS lifting, standing, squatting, stairs, transfers, and locomotion level  PARTICIPATION LIMITATIONS:  cleaning, laundry, driving, shopping, community activity, and yard work  PERSONAL FACTORS Transportation are also affecting  patient's functional outcome.   REHAB POTENTIAL: Excellent  CLINICAL DECISION MAKING: Stable/uncomplicated  EVALUATION COMPLEXITY: Low   GOALS: Goals reviewed with patient? Yes  SHORT TERM GOALS: Target date: 08/12/2021  Patient will be independent with her initial HEP.  Baseline: Goal status: MET  2.  Patient will be able to demonstrate at least 5 degrees of active right ankle dorsiflexion for improved gait mechanics.  Baseline:  Goal status: MET  LONG TERM GOALS: Target date: 09/02/2021   Patient will be independent with her advanced HEP.  Baseline:  Goal status: MET  2.  Patient will be able to ambulate with no significant gait deviations for improved functional mobility.  Baseline:  Goal status: MET  3.  Patient will be able to navigate at least 4 steps with a reciprocal pattern for improved functional mobility.  Baseline:  Goal status: MET  4.  Patient will be able to walk at least 30 minutes for improved function walking her dogs.  Baseline:  Goal status: MET   PLAN: PT FREQUENCY: 1-2x/week  PT DURATION: 6 weeks  PLANNED INTERVENTIONS: Therapeutic exercises, Therapeutic activity, Neuromuscular re-education, Balance training, Gait training, Patient/Family education, Self Care, Joint mobilization, Stair training, Electrical stimulation, Cryotherapy, Moist heat, Taping, Vasopneumatic device, Contrast bath, Manual therapy, and Re-evaluation  PLAN FOR NEXT SESSION: nustep, BAPS PF/DF and IV/EV, rocker board, and modalities as needed   Darlin Coco, PT 09/15/2021, 11:45 AM

## 2021-09-20 LAB — HM MAMMOGRAPHY

## 2021-09-21 ENCOUNTER — Encounter: Payer: Self-pay | Admitting: Family Medicine

## 2021-12-01 ENCOUNTER — Ambulatory Visit: Payer: No Typology Code available for payment source | Admitting: Obstetrics and Gynecology

## 2021-12-07 NOTE — Progress Notes (Signed)
61 y.o. G74P2012 Married Caucasian female here for annual exam.   Good control of her bladder function.  Bowel function is good also.  2 - 3 BMs per day.   No vaginal bleeding.   Broke her right leg this year doing an obstacle course.  Had surgery. Her leg is aching.  She has not contacted her orthopedist.   PCP: Betty [place], MD  Patient's last menstrual period was 01/03/2009.           Sexually active: Yes.    The current method of family planning is post menopausal status.    Exercising: Yes.     Walking daily  Smoker:  no  Health Maintenance: Pap: 10/23/2020-WNL, HPV-neg, 01/19/2015-WNL, HPV-neg, 10/12/2012-WNL, HPV- neg History of abnormal Pap: yes, hx of cryotherapy in 20s, paps WNL since MMG: 09/07/2021-birads 0, Dx Lt-09/20/2021-birads 1 neg Colonoscopy: Cologuard-03/01/2021-neg BMD: unsure  Result n/a TDaP: 10/12/2012 Gardasil: no HIV: neg years ago Hep C: neg in 2018 Screening Labs:  PCP. Flu vaccine:  declined.    reports that she has quit smoking. She has never used smokeless tobacco. She reports current alcohol use. She reports that she does not use drugs.  Past Medical History:  Diagnosis Date   Allergic rhinitis    Allergy    COVID    2021, 2022   Diverticulosis    History of cataract removal with insertion of prosthetic lens 2019   Bilateral   Multiple sclerosis ([facility])    Urine incontinence    Vitamin D deficiency     Past Surgical History:  Procedure Laterality Date   BLADDER SUSPENSION N/A 08/08/2017   Procedure: TRANSVAGINAL TAPE (TVT) PROCEDURE exact midurethral sling;  Surgeon: Patton Salles, MD;  [facility];  Service: Gynecology;  Laterality: N/A;   BREAST SURGERY  11/2006   Rt.breast Bx--Florid fibrocystic changes--high risk but negative for neoplasm   CYSTOSCOPY N/A 08/08/2017   Procedure: CYSTOSCOPY;  Surgeon: Patton Salles, MD;  Location: [facility];  Service: Gynecology;   Laterality: N/A;   ENDOMETRIAL ABLATION  03/07/2005   -HTA   ENDOMETRIAL BIOPSY  12/15/2004   --benign endocervical polyp--Dr. 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   No current facility-administered medications for this visit.    Family History  Problem Relation Age of Onset   Diabetes Mother    Hypertension Mother    Hyperlipidemia Mother    Cancer Mother 22       pancreatic ca   Diabetes Father    Hypertension Father    Stroke Father    Hearing loss Father    Heart attack Father    Kidney disease Father    Thyroid disease Sister    Breast cancer Maternal Grandmother    Cancer Maternal Grandmother    Ovarian cancer Paternal Grandmother    Diabetes Paternal Grandmother    Cancer Paternal Grandmother     Review of Systems  All other systems reviewed and are negative.   Exam:   BP 122/72   Pulse 70   Ht 5\' 2"  (1.575 m) Comment: w/ shoes  Wt 166 lb (75.3 kg)   LMP 01/03/2009   SpO2 95%   BMI 30.36 kg/m     General appearance: alert, cooperative and appears stated age Head: normocephalic, without obvious abnormality, atraumatic Neck: no adenopathy, supple, symmetrical, trachea midline and thyroid normal to inspection and palpation Lungs: clear to auscultation bilaterally Breasts: normal appearance, no masses or tenderness, bilateral nipple inversion (Old standing)  No nipple discharge or bleeding, No axillary adenopathy Heart: regular rate and rhythm Abdomen: soft, non-tender; no masses, no organomegaly Extremities: extremities normal, atraumatic, no cyanosis or edema Skin: skin color, texture, turgor normal. No rashes or lesions Lymph  nodes: cervical, supraclavicular, and axillary nodes normal. Neurologic: grossly normal  Pelvic: External genitalia:  no lesions              No abnormal inguinal nodes palpated.              Urethra:  normal appearing urethra with no masses, tenderness or lesions              Bartholins and Skenes: normal                 Vagina: normal appearing vagina with normal color and discharge, no lesions.  First degree rectocele.               Cervix: no lesions              Pap taken: no Bimanual Exam:  Uterus:  normal size, contour, position, consistency, mobility, non-tender              Adnexa: no mass, fullness, tenderness              Rectal exam: yes.  Confirms.              Anus:  normal sphincter tone, no lesions  Chaperone was present for exam:  Lovena Le  Assessment:   Well woman visit with gynecologic exam. Remote hx of abnormal pap.  Hx of endometrial ablation.  Status post TVT Exact midurethral sling and rectocele repair.  Minor rectocele today. FH of breast cancer and ovarian cancer - each on different side of the family.  Bilateral nipple inversion.  Old change. Right leg fracture.   Plan: Mammogram screening discussed. Self breast awareness reviewed. Pap and HR HPV 2027. Guidelines for Calcium, Vitamin D, regular exercise program including cardiovascular and weight bearing exercise. BMD next year.   She will follow up with her orthopedist for any concerns about her leg.  Follow up annually and prn.   After visit summary provided.

## 2021-12-10 ENCOUNTER — Encounter: Payer: Self-pay | Admitting: Obstetrics and Gynecology

## 2021-12-10 ENCOUNTER — Ambulatory Visit (INDEPENDENT_AMBULATORY_CARE_PROVIDER_SITE_OTHER): Payer: No Typology Code available for payment source | Admitting: Obstetrics and Gynecology

## 2021-12-10 VITALS — BP 122/72 | HR 70 | Ht 62.0 in | Wt 166.0 lb

## 2021-12-10 DIAGNOSIS — Z01419 Encounter for gynecological examination (general) (routine) without abnormal findings: Secondary | ICD-10-CM

## 2021-12-10 NOTE — Patient Instructions (Signed)

## 2022-01-03 DIAGNOSIS — G510 Bell's palsy: Secondary | ICD-10-CM

## 2022-01-03 HISTORY — DX: Bell's palsy: G51.0

## 2022-09-13 LAB — HM MAMMOGRAPHY

## 2022-10-24 ENCOUNTER — Ambulatory Visit (INDEPENDENT_AMBULATORY_CARE_PROVIDER_SITE_OTHER): Payer: Commercial Managed Care - PPO | Admitting: Family Medicine

## 2022-10-24 ENCOUNTER — Encounter: Payer: Self-pay | Admitting: Family Medicine

## 2022-10-24 VITALS — BP 122/70 | HR 76 | Resp 16 | Ht 62.0 in | Wt 163.2 lb

## 2022-10-24 DIAGNOSIS — H6993 Unspecified Eustachian tube disorder, bilateral: Secondary | ICD-10-CM

## 2022-10-24 DIAGNOSIS — H8111 Benign paroxysmal vertigo, right ear: Secondary | ICD-10-CM

## 2022-10-24 MED ORDER — MECLIZINE HCL 25 MG PO TABS: 25.0000 mg | ORAL_TABLET | Freq: Three times a day (TID) | ORAL | 0 refills | Status: DC | PRN

## 2022-10-24 NOTE — Progress Notes (Signed)
ACUTE VISIT Chief Complaint  Patient presents with   Ear Pain    Both ears, went on plane ride about a month ago & feels fluid in both ears now. Has pain at times.    HPI: [name] is a 62 y.o. female with a PMHx significant for HLD and constipation, who is here today with above complaints.  Otalgia  There is pain in both ears. This is a new problem. There has been no fever. The pain is mild. Pertinent negatives include no abdominal pain, coughing, diarrhea, ear discharge, headaches, neck pain, rash, rhinorrhea, sore throat or vomiting. She has tried ear drops and heat packs for the symptoms. There is no history of a chronic ear infection.   She complains of bilateral ear pain about 2 days after a flight a month ago, noted it when she was flying back. Her ears have continued to hurt, and she endorses associated tinnitus, episodes of vertigo, and feels like there is fluid in her ears. She also mentions a slight echo she is hearing.  Spinning like sensation when in bed and turning her head, it last a few seconds.  No changes in hearing but mentions that she has been told about hearing loss in the past.  She has tried ear drops, and used a hair dryer on low to try and dry out her ears. She has also used a heating pad when she has ear pain.  Pertinent negatives include fever, chills, upper respiratory symptoms, headache, or vision changes.   Review of Systems  Constitutional:  Negative for appetite change, chills and fever.  HENT:  Positive for ear pain. Negative for ear discharge, rhinorrhea and sore throat.   Respiratory:  Negative for cough and shortness of breath.   Cardiovascular:  Negative for chest pain and palpitations.  Gastrointestinal:  Negative for abdominal pain, diarrhea and vomiting.  Endocrine: Negative for cold intolerance and heat intolerance.  Genitourinary:  Negative for decreased urine volume, dysuria and hematuria.  Musculoskeletal:  Negative for neck  pain.  Skin:  Negative for rash.  Allergic/Immunologic: Positive for environmental allergies.  Neurological:  Negative for syncope, weakness and headaches.  Psychiatric/Behavioral:  Negative for confusion and hallucinations.   See other pertinent positives and negatives in HPI.  No current outpatient medications on file prior to visit.   No current facility-administered medications on file prior to visit.   Past Medical History:  Diagnosis Date   Allergic rhinitis    Allergy    COVID    2021, 2022   Diverticulosis    History of cataract removal with insertion of prosthetic lens 2019   Bilateral   Multiple sclerosis ([facility])    Urine incontinence    Vitamin D deficiency    Allergies  Allergen Reactions   Penicillins Hives   Sulfa Antibiotics Rash   Social History   Socioeconomic History   Marital status: Married    Spouse name: Not on file   Number of children: 2   Years of education: Not on file   Highest education level: Not on file  Occupational History   Not on file  Tobacco Use   Smoking status: Former   Smokeless tobacco: Never  Vaping Use   Vaping status: Never Used  Substance and Sexual Activity   Alcohol use: Yes    Comment: occ   Drug use: No   Sexual activity: Yes    Partners: Male    Birth control/protection: Post-menopausal  Other Topics Concern  Not on file  Social History Narrative   Not on file   Social Determinants of Health   Financial Resource Strain: Not on file  Food Insecurity: Not on file  Transportation Needs: Not on file  Physical Activity: Not on file  Stress: Not on file  Social Connections: Not on file    Vitals:   10/24/22 1212  BP: 122/70  Pulse: 76  Resp: 16  SpO2: 97%   Body mass index is 29.86 kg/m.  Physical Exam Vitals and nursing note reviewed.  Constitutional:      General: She is not in acute distress.    Appearance: She is well-developed.  HENT:     Head: Normocephalic and atraumatic.     Right Ear:  No tenderness. A middle ear effusion is present. No mastoid tenderness.     Left Ear: No tenderness. A middle ear effusion is present. No mastoid tenderness.     Ears:     Comments: Apley maneuver and Dix-Hallpike maneuver elicited vertigo when turning head to right.Minimal nystagmus associated.    Mouth/Throat:     Mouth: Mucous membranes are moist.     Pharynx: Oropharynx is clear.  Eyes:     Conjunctiva/sclera: Conjunctivae normal.  Cardiovascular:     Rate and Rhythm: Normal rate and regular rhythm.     Heart sounds: No murmur heard. Pulmonary:     Effort: Pulmonary effort is normal. No respiratory distress.     Breath sounds: Normal breath sounds.  Lymphadenopathy:     Cervical: No cervical adenopathy.  Skin:    General: Skin is warm.     Findings: No erythema or rash.  Neurological:     General: No focal deficit present.     Mental Status: She is alert and oriented to person, place, and time.     Cranial Nerves: No cranial nerve deficit.     Gait: Gait normal.  Psychiatric:        Mood and Affect: Mood and affect normal.   ASSESSMENT AND PLAN:  Ms. Urrea was seen today for ear pain.   Dysfunction of both eustachian tubes We discussed Dx, prognosis,and treatment options. Sudafed 12 hours daily for 10-14 days may help, side effects discussed. Autoinflation maneuvers a few times during the day. To prevent future episode while flying, she can take a Sudafed 12 hours when boarding and chew gum during flight. Abnormal hearing screening, reports hx of hearing loss. She prefers to hold on ENT referral. Instructed about warning signs.  Hearing Screening   500Hz  1000Hz  2000Hz  4000Hz   Right ear Fail Pass Fail Fail  Left ear Fail Pass Fail Fail    Benign paroxysmal positional vertigo of right ear We discussed other possible etiologies of dizziness, Hx and examination today suggest benign vertigo. I do not think further work-up is necessary at this time.  Explained that  problem can be recurrent. Fall prevention. Vestibular exercises recommended, handout with Semont maneuvers given. Meclizine 25 mg tid prn, some side effects discussed. Instructed about warning signs. F/U as needed.  -     Meclizine HCl; Take 1 tablet (25 mg total) by mouth 3 (three) times daily as needed for dizziness.  Dispense: 30 tablet; Refill: 0   Return if symptoms worsen or fail to improve, for keep next appointment.  I, Suanne Marker, acting as a scribe for Rhodesia Stanger [place], MD., have documented all relevant documentation on the behalf of Versia Mignogna [place], MD, as directed by  Shawni Volkov [place], MD while in  the presence of Sylwia Cuervo [place], MD.   I, Elston Aldape Sw[place], MD, have reviewed all documentation for this visit. The documentation on 10/25/22 for the exam, diagnosis, procedures, and orders are all accurate and complete.  Danaria Larsen G. [place], MD  [facility]. [place] office.

## 2022-10-24 NOTE — Patient Instructions (Addendum)
A few things to remember from today's visit:  Dysfunction of both eustachian tubes  Benign paroxysmal positional vertigo of right ear - Plan: meclizine (ANTIVERT) 25 MG tablet Sudafed 12 hours daily for 10-14 days may help. Popping your ears a few times per day. Let me know if you want to see ENT.  Dizziness is a perception of movement, it is sometimes difficult to describe and can be  caused by different problems, most benign but others can be life threaten.  Vertigo is the most common cause of dizziness, usually related with inner ear and can be associated with nausea, vomiting, and unbalance sensation. It can be complicated by falls due to lose of balance; so fall precautions are very important.  Most of the time dizziness is benign, usually intermittent, last a few seconds at the time and aggravated by certain positions. It usually resolves in a few weeks without residual effect but it could be recurrent.  Sometimes blood work is ordered to evaluate for other possible causes.  Dizziness can also be caused by certain medications, dehydration, migraines, and strokes.  Medication prescribed for vertigo, Meclizine, causes drowsiness/sleepiness, so frequently I recommended taking it at bedtime. I also recommend what we called vestibular exercise, sometimes can be done at home (Modified Semont maneuvers) other times I refer patients to vestibular rehabilitation.  Seek immediate medical attention if: New severe headache, dobble vision, fever (100 F or more), associated numbness/tingling, focal weakness, persistent vomiting, not able to walk, or sudden worsening symptoms.  If you need refills for medications you take chronically, please call your pharmacy. Do not use My Chart to request refills or for acute issues that need immediate attention. If you send a my chart message, it may take a few days to be addressed, specially if I am not in the office.  Please be sure medication list is  accurate. If a new problem present, please set up appointment sooner than planned today.

## 2022-11-23 IMAGING — CT CT ABD-PELV W/ CM
2 of 5 series · 16 of 46 positions shown, 18 images · IV contrast (Omnipaque)
Comparison: Noncontrast CT on 01/11/2017

CLINICAL DATA: Left lower quadrant pain for 5 days. Diarrhea.
Hematochezia.

EXAM:
CT ABDOMEN AND PELVIS WITH CONTRAST
TECHNIQUE: Multidetector CT imaging of the abdomen and pelvis was performed
using the standard protocol following bolus administration of
intravenous contrast.

[Series 2: axial st · axial · 0.93mm/px · z∈[-414,-24]mm · 13 of 88 slices shown, 15 images]
[im 5/88  soft-tissue]
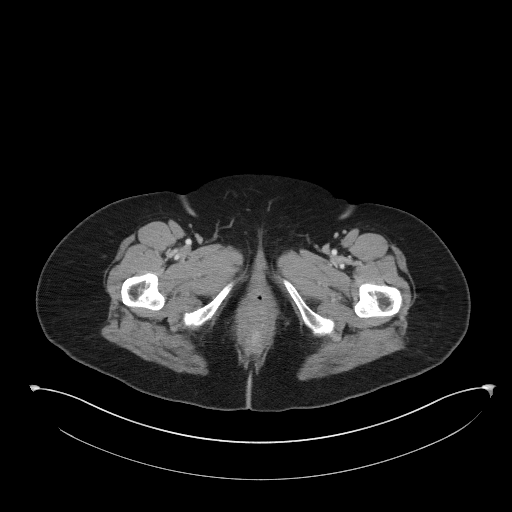
[im 5/88  bone]
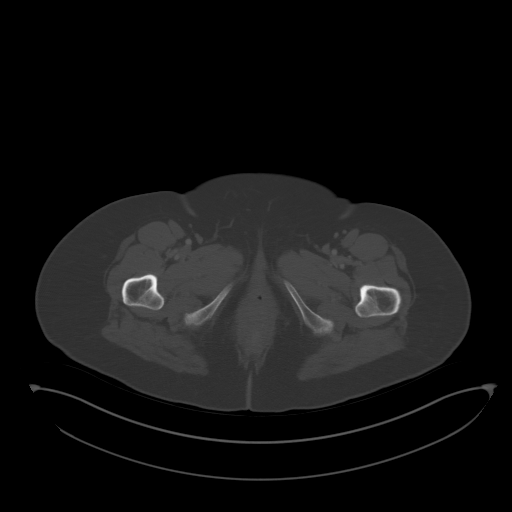
[im 14/88  soft-tissue]
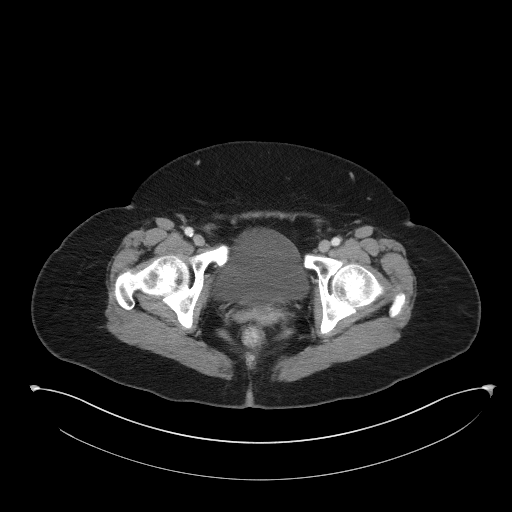
[im 19/88  soft-tissue]
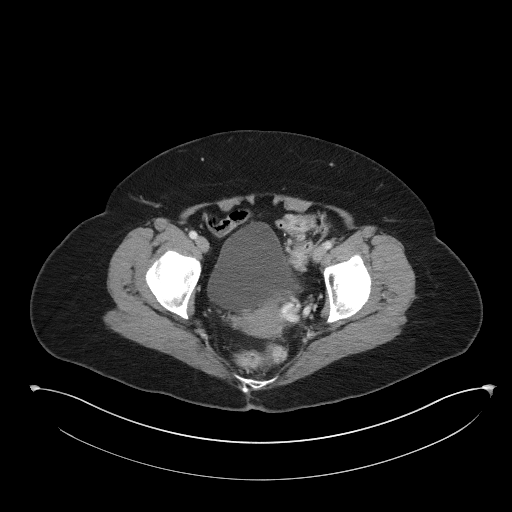
[im 23/88  soft-tissue]
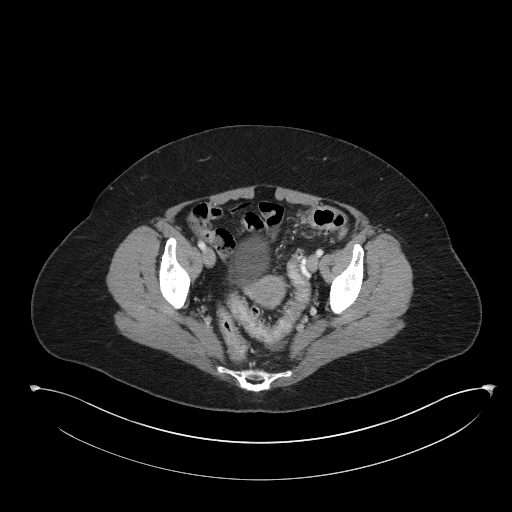
[im 33/88  soft-tissue]
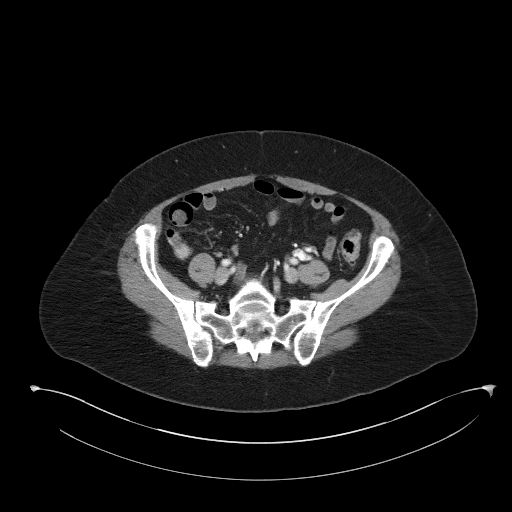
[im 37/88  soft-tissue]
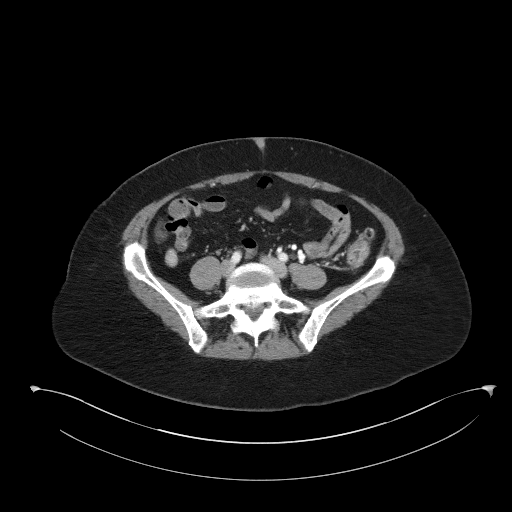
[im 46/88  soft-tissue]
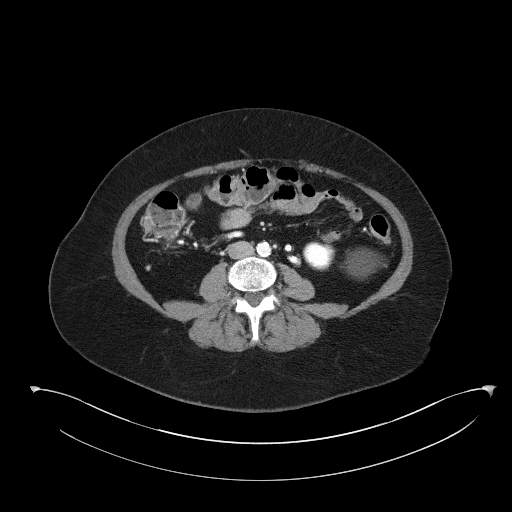
[im 51/88  soft-tissue]
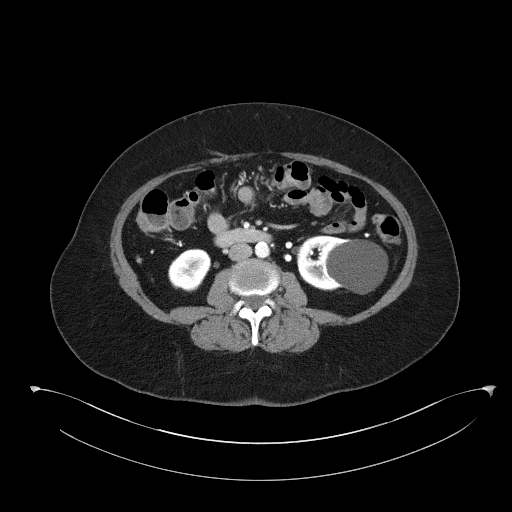
[im 55/88  soft-tissue]
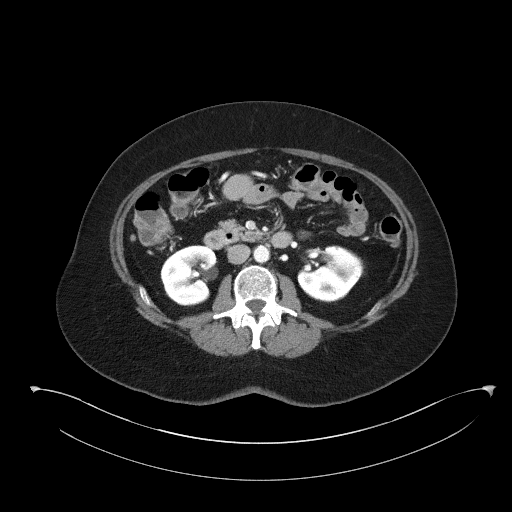
[im 55/88  bone]
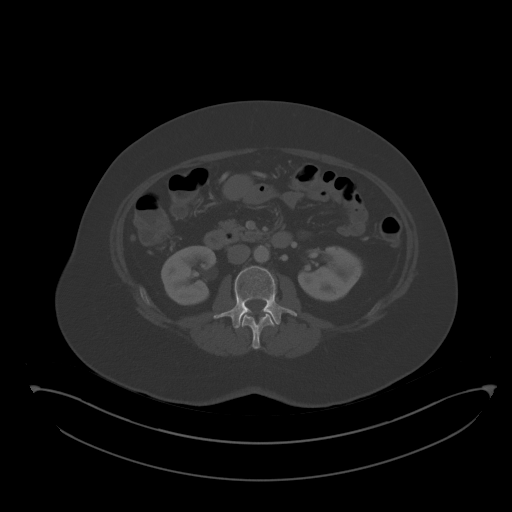
[im 65/88  soft-tissue]
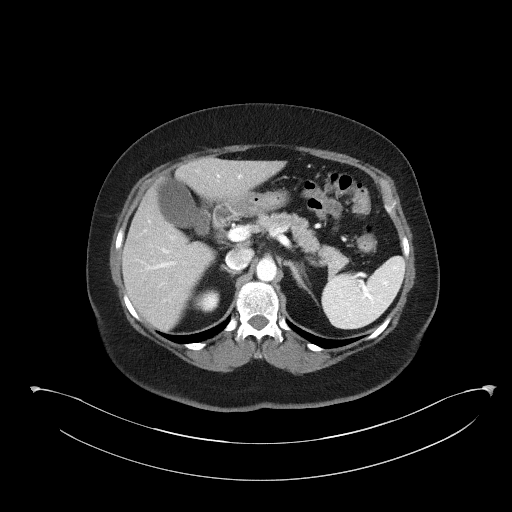
[im 69/88  soft-tissue]
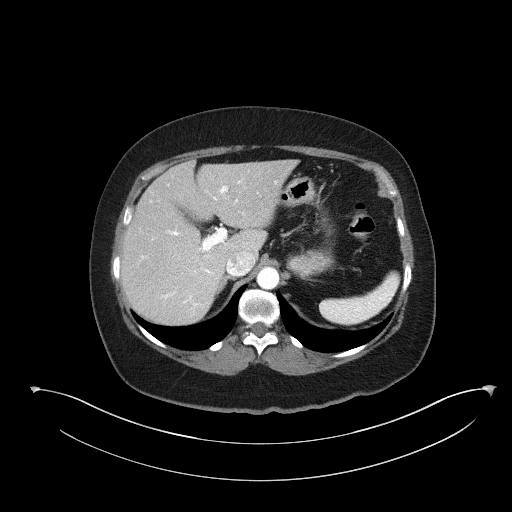
[im 74/88  soft-tissue]
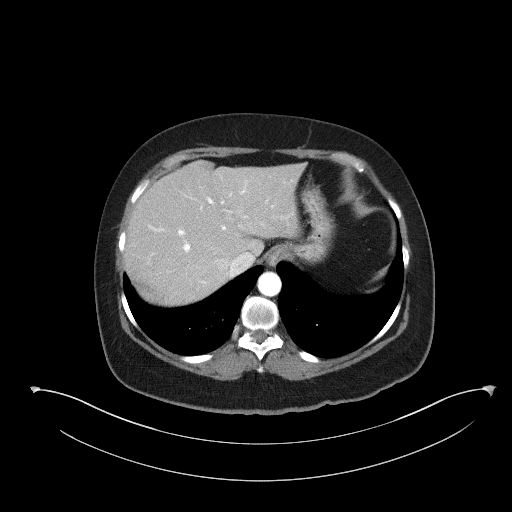
[im 83/88  soft-tissue]
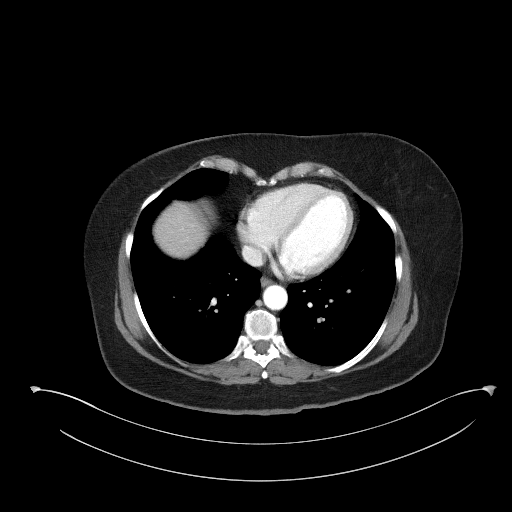

[Series 5: coronal st · coronal · 0.79mm/px · 3 of 98 slices shown]
[im 33/98  soft-tissue]
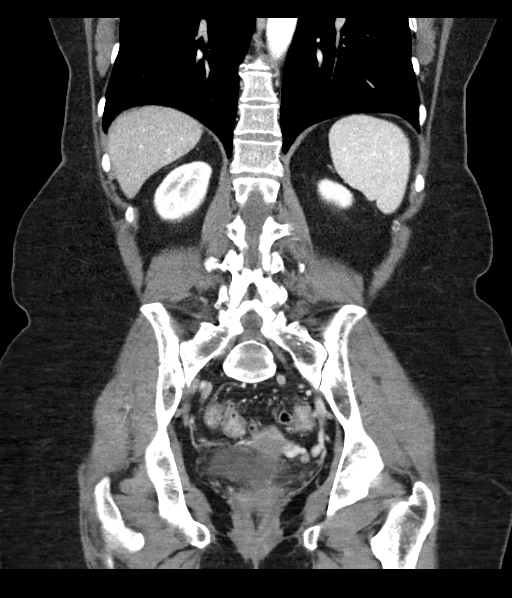
[im 44/98  soft-tissue]
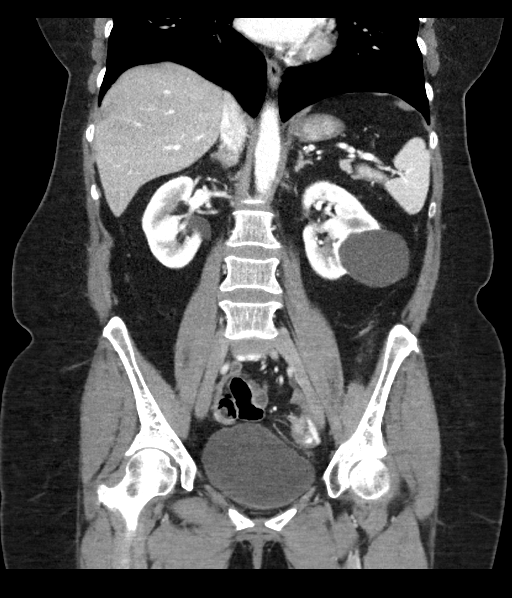
[im 54/98  soft-tissue]
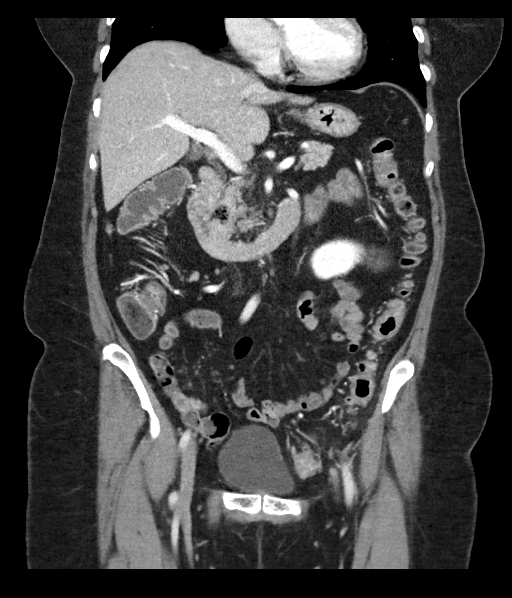

[16 of 46 positions shown; findings below may reference images not displayed]

RADIATION DOSE REDUCTION: This exam was performed according to the
departmental dose-optimization program which includes automated
exposure control, adjustment of the mA and/or kV according to
patient size and/or use of iterative reconstruction technique.

CONTRAST:  100mL OMNIPAQUE IOHEXOL 300 MG/ML  SOLN
FINDINGS: Lower Chest: No acute findings.

Hepatobiliary: No hepatic masses identified. Gallbladder is
unremarkable. No evidence of biliary ductal dilatation.

Pancreas:  No mass or inflammatory changes.

Spleen: Within normal limits in size and appearance.

Adrenals/Urinary Tract: No masses identified. 6 cm left renal cyst
again noted. No evidence of ureteral calculi or hydronephrosis.

Stomach/Bowel: Diverticulosis is seen mainly involving the
descending and sigmoid colon, however there is no evidence of
diverticulitis. Normal appendix visualized. No evidence of bowel
obstruction, inflammatory process, or abnormal fluid collections.

Vascular/Lymphatic: No pathologically enlarged lymph nodes. No acute
vascular findings. Aortic atherosclerotic calcification noted.

Reproductive:  No mass or other significant abnormality.

Other:  None.

Musculoskeletal:  No suspicious bone lesions identified.
IMPRESSION: Colonic diverticulosis, without radiographic evidence of
diverticulitis or other acute findings.

Aortic Atherosclerosis ([id number]).

## 2023-01-11 DIAGNOSIS — H903 Sensorineural hearing loss, bilateral: Secondary | ICD-10-CM | POA: Diagnosis not present

## 2023-01-11 DIAGNOSIS — B0221 Postherpetic geniculate ganglionitis: Secondary | ICD-10-CM | POA: Diagnosis not present

## 2023-01-11 DIAGNOSIS — R42 Dizziness and giddiness: Secondary | ICD-10-CM | POA: Diagnosis not present

## 2023-05-01 ENCOUNTER — Encounter: Admitting: Family Medicine

## 2023-06-07 ENCOUNTER — Ambulatory Visit (INDEPENDENT_AMBULATORY_CARE_PROVIDER_SITE_OTHER): Admitting: Family Medicine

## 2023-06-07 ENCOUNTER — Encounter: Payer: Self-pay | Admitting: Family Medicine

## 2023-06-07 VITALS — BP 122/70 | HR 76 | Temp 98.1°F | Resp 16 | Ht 62.0 in | Wt 162.0 lb

## 2023-06-07 DIAGNOSIS — G250 Essential tremor: Secondary | ICD-10-CM

## 2023-06-07 DIAGNOSIS — E785 Hyperlipidemia, unspecified: Secondary | ICD-10-CM

## 2023-06-07 DIAGNOSIS — Z780 Asymptomatic menopausal state: Secondary | ICD-10-CM

## 2023-06-07 DIAGNOSIS — Z0000 Encounter for general adult medical examination without abnormal findings: Secondary | ICD-10-CM | POA: Diagnosis not present

## 2023-06-07 DIAGNOSIS — E559 Vitamin D deficiency, unspecified: Secondary | ICD-10-CM | POA: Diagnosis not present

## 2023-06-07 LAB — COMPREHENSIVE METABOLIC PANEL WITH GFR
ALT: 16 U/L (ref 0–35)
AST: 17 U/L (ref 0–37)
Albumin: 4.4 g/dL (ref 3.5–5.2)
Alkaline Phosphatase: 73 U/L (ref 39–117)
BUN: 16 mg/dL (ref 6–23)
CO2: 29 meq/L (ref 19–32)
Calcium: 9.7 mg/dL (ref 8.4–10.5)
Chloride: 102 meq/L (ref 96–112)
Creatinine, Ser: 0.65 mg/dL (ref 0.40–1.20)
GFR: 92.43 mL/min (ref >60.00)
Glucose, Bld: 83 mg/dL (ref 70–99)
Potassium: 4 meq/L (ref 3.5–5.1)
Sodium: 138 meq/L (ref 135–145)
Total Bilirubin: 0.4 mg/dL (ref 0.2–1.2)
Total Protein: 7.1 g/dL (ref 6.0–8.3)

## 2023-06-07 LAB — LIPID PANEL
Cholesterol: 193 mg/dL (ref 0–200)
HDL: 54.8 mg/dL (ref >39.00)
LDL Cholesterol: 112 mg/dL — ABNORMAL HIGH (ref 0–99)
NonHDL: 138.48
Total CHOL/HDL Ratio: 4
Triglycerides: 133 mg/dL (ref 0.0–149.0)
VLDL: 26.6 mg/dL (ref 0.0–40.0)

## 2023-06-07 LAB — TSH: TSH: 0.88 u[IU]/mL (ref 0.35–5.50)

## 2023-06-07 LAB — VITAMIN D 25 HYDROXY (VIT D DEFICIENCY, FRACTURES): VITD: 25.85 ng/mL — ABNORMAL LOW (ref 30.00–100.00)

## 2023-06-07 LAB — CBC
HCT: 42.7 % (ref 36.0–46.0)
Hemoglobin: 14.4 g/dL (ref 12.0–15.0)
MCHC: 33.8 g/dL (ref 30.0–36.0)
MCV: 87.4 fl (ref 78.0–100.0)
Platelets: 252 10*3/uL (ref 150.0–400.0)
RBC: 4.89 Mil/uL (ref 3.87–5.11)
RDW: 13 % (ref 11.5–15.5)
WBC: 7.8 10*3/uL (ref 4.0–10.5)

## 2023-06-07 NOTE — Assessment & Plan Note (Signed)
 We discussed the importance of regular physical activity and healthy diet for prevention of chronic illness and/or complications. Preventive guidelines reviewed. Vaccination: Declined. Ca++ and vit D supplementation recommended. DEXA order placed. Continue her female preventive care with gyn, planning on scheduling her mammogram. Next CPE in a year.

## 2023-06-07 NOTE — Progress Notes (Unsigned)
 HPI: [name] is a 63 y.o. female with a PMHx significant for HLD and constipation, who is here today for chronic disease management and CPE.  Last seen on 10/24/2022.  Exercise: Walks daily and also dose some yoga and strength training. Food: Trying to eat healthy and is avoiding sugar.  She has an eye care provider and dentist she sees regularly.  Health Maintenance  Topic Date Due   Medicare Annual Wellness Visit  Never done   HIV Screening  Never done   Pneumonia Vaccine (1 of 1 - PCV) Never done   DEXA scan (bone density measurement)  Never done   COVID-19 Vaccine (1 - 2024-25 season) 06/23/2023*   DTaP/Tdap/Td vaccine (2 - Td or Tdap) 01/12/2024*   Zoster (Shingles) Vaccine (1 of 2) 01/12/2024*   Colon Cancer Screening  03/07/2024*   Flu Shot  08/04/2023   Mammogram  09/12/2024   Pap with HPV screening  10/23/2025   Hepatitis C Screening  Completed   HPV Vaccine  Aged Out   Meningitis B Vaccine  Aged Out  *Topic was postponed. The date shown is not the original due date.   Immunization History  Administered Date(s) Administered   Tdap 10/12/2012   Hyperlipidemia: She is not on any pharmacological treatment. Lab Results  Component Value Date   CHOL 216 (H) 10/23/2020   HDL 59 10/23/2020   LDLCALC 134 (H) 10/23/2020   TRIG 118 10/23/2020   CHOLHDL 3.7 10/23/2020   Constipation: Symptoms are controlled. She is having normal daily bowel movements. No acute concerns.   Vertigo: Pt had tinnitus and vertigo since last being seen here on 10/24/2022.  She saw an ENT, who recommend hearing aid,but she declined it.  She started more holistic remedies . She did these remedies for 3 months and all her symptoms resolved.   Vitamin D  deficiency  She is not taking any Vitamin D  or calcium supplements. Lab Results  Component Value Date   VD25OH 28 (L) 10/23/2020     Review of Systems  Constitutional:  Negative for activity change, appetite change and  fever.  HENT:  Negative for mouth sores, sore throat and trouble swallowing.   Eyes:  Negative for redness and visual disturbance.  Respiratory:  Negative for cough, shortness of breath and wheezing.   Cardiovascular:  Negative for chest pain and leg swelling.  Gastrointestinal:  Negative for abdominal pain, nausea and vomiting.  Endocrine: Negative for cold intolerance, heat intolerance, polydipsia, polyphagia and polyuria.  Genitourinary:  Negative for decreased urine volume, dysuria and hematuria.  Musculoskeletal:  Negative for gait problem and myalgias.  Skin:  Negative for color change and rash.  Allergic/Immunologic: Positive for environmental allergies.  Neurological:  Negative for syncope, weakness, numbness and headaches.  Hematological:  Negative for adenopathy. Does not bruise/bleed easily.  Psychiatric/Behavioral:  Negative for confusion. The patient is not nervous/anxious.   All other systems reviewed and are negative.  No current outpatient medications on file prior to visit.   No current facility-administered medications on file prior to visit.   Past Medical History:  Diagnosis Date   Allergic rhinitis    Allergy    COVID    2021, 2022   Diverticulosis    History of cataract removal with insertion of prosthetic lens 2019   Bilateral   Multiple sclerosis (HCC)    Urine incontinence    Vitamin D  deficiency    Allergies  Allergen Reactions   Penicillins Hives   Sulfa Antibiotics  Rash   Social History   Socioeconomic History   Marital status: Married    Spouse name: Not on file   Number of children: 2   Years of education: Not on file   Highest education level: Not on file  Occupational History   Not on file  Tobacco Use   Smoking status: Former   Smokeless tobacco: Never  Vaping Use   Vaping status: Never Used  Substance and Sexual Activity   Alcohol use: Yes    Comment: occ   Drug use: No   Sexual activity: Yes    Partners: Male    Birth  control/protection: Post-menopausal  Other Topics Concern   Not on file  Social History Narrative   Not on file   Social Drivers of Health   Financial Resource Strain: Not on file  Food Insecurity: Not on file  Transportation Needs: Not on file  Physical Activity: Not on file  Stress: Not on file  Social Connections: Not on file   Vitals:   06/07/23 0945  BP: 122/70  Pulse: 76  Resp: 16  Temp: 98.1 F (36.7 C)  SpO2: 96%   Body mass index is 29.63 kg/m.  Physical Exam Vitals and nursing note reviewed.  Constitutional:      General: She is not in acute distress.    Appearance: She is well-developed.  HENT:     Head: Normocephalic and atraumatic.     Right Ear: Tympanic membrane, ear canal and external ear normal.     Left Ear: Tympanic membrane, ear canal and external ear normal.     Mouth/Throat:     Mouth: Mucous membranes are moist.     Pharynx: Oropharynx is clear. Uvula midline.  Eyes:     Conjunctiva/sclera: Conjunctivae normal.     Pupils: Pupils are equal, round, and reactive to light.  Neck:     Thyroid: No thyroid mass or thyromegaly.  Cardiovascular:     Rate and Rhythm: Normal rate and regular rhythm.     Pulses:          Posterior tibial pulses are 2+ on the right side and 2+ on the left side.     Heart sounds: No murmur heard. Pulmonary:     Effort: Pulmonary effort is normal. No respiratory distress.     Breath sounds: Normal breath sounds.  Abdominal:     Palpations: Abdomen is soft. There is no hepatomegaly or mass.     Tenderness: There is no abdominal tenderness.  Lymphadenopathy:     Cervical: No cervical adenopathy.  Skin:    General: Skin is warm.     Findings: No erythema or rash.  Neurological:     General: No focal deficit present.     Mental Status: She is alert and oriented to person, place, and time.     Cranial Nerves: No cranial nerve deficit.     Motor: Tremor (head) present.     Gait: Gait normal.     Deep Tendon  Reflexes:     Reflex Scores:      Bicep reflexes are 2+ on the right side.      Patellar reflexes are 2+ on the right side and 2+ on the left side. Psychiatric:        Mood and Affect: Mood and affect normal.    ASSESSMENT AND PLAN:  Ms. Gabrielle Patterson was seen today for chronic disease management:  Orders Placed This Encounter  Procedures   DG Bone Density  Comprehensive metabolic panel with GFR   TSH   VITAMIN D  25 Hydroxy (Vit-D Deficiency, Fractures)   Lipid panel   CBC   Lab Results  Component Value Date   WBC 7.8 06/07/2023   HGB 14.4 06/07/2023   HCT 42.7 06/07/2023   MCV 87.4 06/07/2023   PLT 252.0 06/07/2023   Lab Results  Component Value Date   TSH 0.88 06/07/2023   Lab Results  Component Value Date   NA 138 06/07/2023   CL 102 06/07/2023   K 4.0 06/07/2023   CO2 29 06/07/2023   BUN 16 06/07/2023   CREATININE 0.65 06/07/2023   GFR 92.43 06/07/2023   CALCIUM 9.7 06/07/2023   ALBUMIN 4.4 06/07/2023   GLUCOSE 83 06/07/2023   Lab Results  Component Value Date   ALT 16 06/07/2023   AST 17 06/07/2023   ALKPHOS 73 06/07/2023   BILITOT 0.4 06/07/2023   Lab Results  Component Value Date   VD25OH 25.85 (L) 06/07/2023   Lab Results  Component Value Date   CHOL 193 06/07/2023   HDL 54.80 06/07/2023   LDLCALC 112 (H) 06/07/2023   TRIG 133.0 06/07/2023   CHOLHDL 4 06/07/2023   Routine general medical examination at a health care facility Assessment & Plan: We discussed the importance of regular physical activity and healthy diet for prevention of chronic illness and/or complications. Preventive guidelines reviewed. Vaccination: Declined. Ca++ and vit D supplementation recommended. DEXA order placed. Continue her female preventive care with gyn, planning on scheduling her mammogram. Next CPE in a year.   Hyperlipidemia, unspecified hyperlipidemia type Assessment & Plan: Continue non pharmacologic treatment. Further recommendations will be given  according to 10 years CVD risk score and lipid panel numbers.  Orders: -     Comprehensive metabolic panel with GFR; Future -     Lipid panel; Future  Asymptomatic postmenopausal estrogen deficiency -     DG Bone Density; Future  Vitamin D  deficiency, unspecified Assessment & Plan: Mild. Further recommendations according to 25 OH vit D result.  Orders: -     Comprehensive metabolic panel with GFR; Future -     VITAMIN D  25 Hydroxy (Vit-D Deficiency, Fractures); Future  Benign head tremor Assessment & Plan: Mild, noted today. Most likely essential tremor, we discussed Dx. Positive Fhx. Monitor for changes.  Orders: -     Comprehensive metabolic panel with GFR; Future -     TSH; Future -     CBC; Future   Return in about 1 year (around 06/06/2024) for CPE.  I, Fritz Jewel Wierda, acting as a scribe for Brailen Macneal [place], MD., have documented all relevant documentation on the behalf of Gabrielle Greening [place], MD, as directed by  Gabrielle Toenjes [place], MD while in the presence of Markeria Goetsch [place], MD.   I, Amneet Cendejas [place], MD, have reviewed all documentation for this visit. The documentation on 06/07/23 for the exam, diagnosis, procedures, and orders are all accurate and complete.  Lashanta Elbe G. [place], MD  [facility]. [place] office.

## 2023-06-07 NOTE — Patient Instructions (Addendum)
 A few things to remember from today's visit:  Hyperlipidemia, unspecified hyperlipidemia type - Plan: Comprehensive metabolic panel with GFR, Lipid panel  Asymptomatic postmenopausal estrogen deficiency - Plan: DG Bone Density  Vitamin D  deficiency, unspecified - Plan: Comprehensive metabolic panel with GFR, VITAMIN D  25 Hydroxy (Vit-D Deficiency, Fractures)  Benign head tremor - Plan: Comprehensive metabolic panel with GFR, TSH, CBC  Do not use My Chart to request refills or for acute issues that need immediate attention. If you send a my chart message, it may take a few days to be addressed, specially if I am not in the office.  Please be sure medication list is accurate. If a new problem present, please set up appointment sooner than planned today.

## 2023-06-07 NOTE — Assessment & Plan Note (Signed)
 Continue nonpharmacologic treatment. Further recommendations will be given according to 10 years CVD risk score and lipid panel numbers.

## 2023-06-07 NOTE — Assessment & Plan Note (Signed)
 Mild. Further recommendations according to 25 OH vit D result.

## 2023-06-07 NOTE — Assessment & Plan Note (Signed)
 Noted today. Most likely essential tremor, we discussed Dx. Positive Fhx. Monitor for changes.

## 2023-06-08 ENCOUNTER — Ambulatory Visit: Payer: Self-pay | Admitting: Family Medicine

## 2023-07-15 DIAGNOSIS — M25531 Pain in right wrist: Secondary | ICD-10-CM | POA: Diagnosis not present

## 2023-07-21 DIAGNOSIS — S52531A Colles' fracture of right radius, initial encounter for closed fracture: Secondary | ICD-10-CM | POA: Diagnosis not present

## 2023-07-24 ENCOUNTER — Other Ambulatory Visit: Payer: Self-pay

## 2023-07-24 ENCOUNTER — Encounter (HOSPITAL_COMMUNITY): Payer: Self-pay | Admitting: Orthopedic Surgery

## 2023-07-24 NOTE — Progress Notes (Signed)
 SDW CALL  Patient was given pre-op instructions over the phone. The opportunity was given for the patient to ask questions. No further questions asked. Patient verbalized understanding of instructions given.   PCP - Betty [place] Cardiologist - denies  PPM/ICD - denies   Chest x-ray - denies EKG - denies Stress Test - denies ECHO - denies Cardiac Cath - denies  Sleep Study - denies  No DM  Last dose of GLP1 agonist-  n/a GLP1 instructions:  n/a  Blood Thinner Instructions: n/a Aspirin Instructions: n/a  ERAS Protcol - clears until 0430 PRE-SURGERY Ensure or G2- n/a  COVID TEST- n/a   Anesthesia review: no  Patient denies shortness of breath, fever, cough and chest pain over the phone call   All instructions explained to the patient, with a verbal understanding of the material. Patient agrees to go over the instructions while at home for a better understanding.

## 2023-07-25 NOTE — Anesthesia Preprocedure Evaluation (Signed)
 Anesthesia Evaluation  Patient identified by MRN, date of birth, ID band Patient awake    Reviewed: Allergy & Precautions, NPO status , Patient's Chart, lab work & pertinent test results  History of Anesthesia Complications Negative for: history of anesthetic complications  Airway Mallampati: I  TM Distance: >3 FB Neck ROM: Full    Dental  (+) Teeth Intact, Dental Advisory Given   Pulmonary former smoker   breath sounds clear to auscultation       Cardiovascular (-) hypertension(-) Past MI negative cardio ROS  Rhythm:Regular Rate:Normal     Neuro/Psych neg Seizures MS. Left upper and lower extremity weakness during periods of stress.   negative psych ROS   GI/Hepatic negative GI ROS, Neg liver ROS,,,  Endo/Other  negative endocrine ROS    Renal/GU negative Renal ROS   Incontinence, rectocele    Musculoskeletal Closed fracture of distal end of right radius   Abdominal   Peds  Hematology negative hematology ROS (+)   Anesthesia Other Findings   Reproductive/Obstetrics                              Anesthesia Physical Anesthesia Plan  ASA: 3  Anesthesia Plan: MAC and Regional   Post-op Pain Management: Regional block* and Tylenol  PO (pre-op)*   Induction: Intravenous  PONV Risk Score and Plan: 2 and TIVA, Ondansetron  and Dexamethasone   Airway Management Planned: Natural Airway and Simple Face Mask  Additional Equipment: None  Intra-op Plan:   Post-operative Plan:   Informed Consent: I have reviewed the patients History and Physical, chart, labs and discussed the procedure including the risks, benefits and alternatives for the proposed anesthesia with the patient or authorized representative who has indicated his/her understanding and acceptance.     Dental advisory given  Plan Discussed with:   Anesthesia Plan Comments:          Anesthesia Quick Evaluation

## 2023-07-26 ENCOUNTER — Ambulatory Visit (HOSPITAL_COMMUNITY)

## 2023-07-26 ENCOUNTER — Ambulatory Visit (HOSPITAL_BASED_OUTPATIENT_CLINIC_OR_DEPARTMENT_OTHER): Admitting: Anesthesiology

## 2023-07-26 ENCOUNTER — Other Ambulatory Visit: Payer: Self-pay

## 2023-07-26 ENCOUNTER — Encounter (HOSPITAL_COMMUNITY)
Admission: RE | Disposition: A | Discharge status: Home / Self Care | Payer: Self-pay | Source: Home / Self Care | Attending: Orthopedic Surgery

## 2023-07-26 ENCOUNTER — Ambulatory Visit (HOSPITAL_COMMUNITY): Admitting: Anesthesiology

## 2023-07-26 ENCOUNTER — Ambulatory Visit (HOSPITAL_COMMUNITY)
Admission: RE | Admit: 2023-07-26 | Discharge: 2023-07-26 | Disposition: A | Discharge status: Home / Self Care | Attending: Orthopedic Surgery | Admitting: Orthopedic Surgery

## 2023-07-26 DIAGNOSIS — G35 Multiple sclerosis: Secondary | ICD-10-CM | POA: Diagnosis not present

## 2023-07-26 DIAGNOSIS — E66811 Obesity, class 1: Secondary | ICD-10-CM | POA: Diagnosis not present

## 2023-07-26 DIAGNOSIS — Z87891 Personal history of nicotine dependence: Secondary | ICD-10-CM | POA: Insufficient documentation

## 2023-07-26 DIAGNOSIS — S52501A Unspecified fracture of the lower end of right radius, initial encounter for closed fracture: Secondary | ICD-10-CM

## 2023-07-26 DIAGNOSIS — E785 Hyperlipidemia, unspecified: Secondary | ICD-10-CM | POA: Diagnosis not present

## 2023-07-26 DIAGNOSIS — Z6828 Body mass index (BMI) 28.0-28.9, adult: Secondary | ICD-10-CM

## 2023-07-26 DIAGNOSIS — R32 Unspecified urinary incontinence: Secondary | ICD-10-CM | POA: Insufficient documentation

## 2023-07-26 DIAGNOSIS — S52551A Other extraarticular fracture of lower end of right radius, initial encounter for closed fracture: Secondary | ICD-10-CM | POA: Diagnosis not present

## 2023-07-26 HISTORY — DX: Other complications of anesthesia, initial encounter: T88.59XA

## 2023-07-26 HISTORY — PX: OPEN REDUCTION INTERNAL FIXATION (ORIF) DISTAL RADIAL FRACTURE: SHX5989

## 2023-07-26 HISTORY — DX: Hyperlipidemia, unspecified: E78.5

## 2023-07-26 LAB — CBC
HCT: 44.3 % (ref 36.0–46.0)
Hemoglobin: 14.4 g/dL (ref 12.0–15.0)
MCH: 29.6 pg (ref 26.0–34.0)
MCHC: 32.5 g/dL (ref 30.0–36.0)
MCV: 91 fL (ref 80.0–100.0)
Platelets: 248 K/uL (ref 150–400)
RBC: 4.87 MIL/uL (ref 3.87–5.11)
RDW: 12.6 % (ref 11.5–15.5)
WBC: 6 K/uL (ref 4.0–10.5)
nRBC: 0 % (ref 0.0–0.2)

## 2023-07-26 SURGERY — OPEN REDUCTION INTERNAL FIXATION (ORIF) DISTAL RADIUS FRACTURE
Anesthesia: Monitor Anesthesia Care | Site: Wrist | Laterality: Right

## 2023-07-26 MED ORDER — EPHEDRINE 5 MG/ML INJ
INTRAVENOUS | Status: AC
  Filled 2023-07-26: qty 5

## 2023-07-26 MED ORDER — 0.9 % SODIUM CHLORIDE (POUR BTL) OPTIME
TOPICAL | Status: DC | PRN
  Administered 2023-07-26: 1000 mL

## 2023-07-26 MED ORDER — ROPIVACAINE HCL 5 MG/ML IJ SOLN
INTRAMUSCULAR | Status: DC | PRN
  Administered 2023-07-26: 25 mL via PERINEURAL

## 2023-07-26 MED ORDER — PROPOFOL 10 MG/ML IV BOLUS
INTRAVENOUS | Status: DC | PRN
  Administered 2023-07-26: 30 mg via INTRAVENOUS

## 2023-07-26 MED ORDER — FENTANYL CITRATE (PF) 250 MCG/5ML IJ SOLN
INTRAMUSCULAR | Status: DC | PRN
  Administered 2023-07-26: 25 ug via INTRAVENOUS
  Administered 2023-07-26: 50 ug via INTRAVENOUS
  Administered 2023-07-26: 25 ug via INTRAVENOUS

## 2023-07-26 MED ORDER — EPHEDRINE SULFATE-NACL 50-0.9 MG/10ML-% IV SOSY
PREFILLED_SYRINGE | INTRAVENOUS | Status: DC | PRN
  Administered 2023-07-26 (×3): 5 mg via INTRAVENOUS

## 2023-07-26 MED ORDER — OXYCODONE HCL 5 MG PO TABS: 5.0000 mg | ORAL_TABLET | Freq: Four times a day (QID) | ORAL | 0 refills | Status: AC | PRN

## 2023-07-26 MED ORDER — ACETAMINOPHEN 10 MG/ML IV SOLN
1000.0000 mg | Freq: Once | INTRAVENOUS | Status: DC | PRN
Start: 2023-07-26 — End: 2023-07-26

## 2023-07-26 MED ORDER — DROPERIDOL 2.5 MG/ML IJ SOLN: 0.6250 mg | Freq: Once | INTRAMUSCULAR | Status: DC | PRN

## 2023-07-26 MED ORDER — CHLORHEXIDINE GLUCONATE 0.12 % MT SOLN
15.0000 mL | Freq: Once | OROMUCOSAL | Status: AC
  Administered 2023-07-26: 15 mL via OROMUCOSAL
  Filled 2023-07-26: qty 15

## 2023-07-26 MED ORDER — PHENYLEPHRINE HCL-NACL 20-0.9 MG/250ML-% IV SOLN
INTRAVENOUS | Status: DC | PRN
  Administered 2023-07-26: 25 ug/min via INTRAVENOUS

## 2023-07-26 MED ORDER — LACTATED RINGERS IV SOLN: INTRAVENOUS | Status: DC

## 2023-07-26 MED ORDER — PHENYLEPHRINE 80 MCG/ML (10ML) SYRINGE FOR IV PUSH (FOR BLOOD PRESSURE SUPPORT)
PREFILLED_SYRINGE | INTRAVENOUS | Status: AC
  Filled 2023-07-26: qty 10

## 2023-07-26 MED ORDER — CEFAZOLIN SODIUM-DEXTROSE 2-4 GM/100ML-% IV SOLN
2.0000 g | INTRAVENOUS | Status: AC
  Administered 2023-07-26: 2 g via INTRAVENOUS
  Filled 2023-07-26: qty 100

## 2023-07-26 MED ORDER — ACETAMINOPHEN 500 MG PO TABS
1000.0000 mg | ORAL_TABLET | Freq: Once | ORAL | Status: AC
  Administered 2023-07-26: 1000 mg via ORAL
  Filled 2023-07-26: qty 2

## 2023-07-26 MED ORDER — PROPOFOL 10 MG/ML IV BOLUS
INTRAVENOUS | Status: AC
  Filled 2023-07-26: qty 20

## 2023-07-26 MED ORDER — FENTANYL CITRATE (PF) 100 MCG/2ML IJ SOLN
INTRAMUSCULAR | Status: AC
  Filled 2023-07-26: qty 2

## 2023-07-26 MED ORDER — ONDANSETRON HCL 4 MG/2ML IJ SOLN
INTRAMUSCULAR | Status: AC
  Filled 2023-07-26: qty 2

## 2023-07-26 MED ORDER — ORAL CARE MOUTH RINSE: 15.0000 mL | Freq: Once | OROMUCOSAL | Status: AC

## 2023-07-26 MED ORDER — OXYCODONE HCL 5 MG PO TABS: 5.0000 mg | ORAL_TABLET | Freq: Once | ORAL | Status: DC | PRN

## 2023-07-26 MED ORDER — PHENYLEPHRINE 80 MCG/ML (10ML) SYRINGE FOR IV PUSH (FOR BLOOD PRESSURE SUPPORT)
PREFILLED_SYRINGE | INTRAVENOUS | Status: DC | PRN
  Administered 2023-07-26 (×2): 80 ug via INTRAVENOUS

## 2023-07-26 MED ORDER — PROPOFOL 500 MG/50ML IV EMUL
INTRAVENOUS | Status: DC | PRN
  Administered 2023-07-26: 100 ug/kg/min via INTRAVENOUS

## 2023-07-26 MED ORDER — BUPIVACAINE HCL (PF) 0.25 % IJ SOLN
INTRAMUSCULAR | Status: AC
  Filled 2023-07-26: qty 30

## 2023-07-26 MED ORDER — OXYCODONE HCL 5 MG/5ML PO SOLN: 5.0000 mg | Freq: Once | ORAL | Status: DC | PRN

## 2023-07-26 MED ORDER — FENTANYL CITRATE (PF) 100 MCG/2ML IJ SOLN: 25.0000 ug | INTRAMUSCULAR | Status: DC | PRN

## 2023-07-26 MED ORDER — ONDANSETRON HCL 4 MG/2ML IJ SOLN
INTRAMUSCULAR | Status: DC | PRN
  Administered 2023-07-26: 4 mg via INTRAVENOUS

## 2023-07-26 SURGICAL SUPPLY — 62 items
BAG COUNTER SPONGE SURGICOUNT (BAG) ×2 IMPLANT
BIT DRILL SOLID 2.0X40MM (BIT) IMPLANT
BIT DRILL SOLID 2.5X40MM (BIT) IMPLANT
BLADE CLIPPER SURG (BLADE) IMPLANT
BNDG COMPR ESMARK 4X3 LF (GAUZE/BANDAGES/DRESSINGS) ×2 IMPLANT
BNDG ELASTIC 3INX 5YD STR LF (GAUZE/BANDAGES/DRESSINGS) ×6 IMPLANT
BNDG ELASTIC 4X5.8 VLCR STR LF (GAUZE/BANDAGES/DRESSINGS) ×2 IMPLANT
BNDG GAUZE DERMACEA FLUFF 4 (GAUZE/BANDAGES/DRESSINGS) ×6 IMPLANT
CORD BIPOLAR FORCEPS 12FT (ELECTRODE) ×2 IMPLANT
COVER SURGICAL LIGHT HANDLE (MISCELLANEOUS) ×2 IMPLANT
CUFF TOURN SGL QUICK 18X4 (TOURNIQUET CUFF) ×2 IMPLANT
CUFF TRNQT CYL 24X4X16.5-23 (TOURNIQUET CUFF) IMPLANT
DRAIN TLS ROUND 10FR (DRAIN) IMPLANT
DRAPE OEC MINIVIEW 54X84 (DRAPES) IMPLANT
DRAPE U-SHAPE 47X51 STRL (DRAPES) ×2 IMPLANT
DRSG ADAPTIC 3X8 NADH LF (GAUZE/BANDAGES/DRESSINGS) ×2 IMPLANT
DRSG XEROFORM 1X8 (GAUZE/BANDAGES/DRESSINGS) IMPLANT
GAUZE SPONGE 4X4 12PLY STRL (GAUZE/BANDAGES/DRESSINGS) ×2 IMPLANT
GAUZE XEROFORM 5X9 LF (GAUZE/BANDAGES/DRESSINGS) ×2 IMPLANT
GLOVE BIO SURGEON STRL SZ7 (GLOVE) ×2 IMPLANT
GLOVE BIOGEL PI IND STRL 7.0 (GLOVE) ×2 IMPLANT
GOWN STRL REUS W/ TWL LRG LVL3 (GOWN DISPOSABLE) ×2 IMPLANT
GUIDE AIMING 1.5MM (WIRE) IMPLANT
KIT BASIN OR (CUSTOM PROCEDURE TRAY) ×2 IMPLANT
KIT TURNOVER KIT B (KITS) ×2 IMPLANT
MANIFOLD NEPTUNE II (INSTRUMENTS) ×2 IMPLANT
NDL 22X1.5 STRL (OR ONLY) (MISCELLANEOUS) IMPLANT
NEEDLE 22X1.5 STRL (OR ONLY) (MISCELLANEOUS) IMPLANT
NS IRRIG 1000ML POUR BTL (IV SOLUTION) ×2 IMPLANT
PACK ORTHO EXTREMITY (CUSTOM PROCEDURE TRAY) ×2 IMPLANT
PAD ARMBOARD POSITIONER FOAM (MISCELLANEOUS) ×4 IMPLANT
PAD CAST 3X4 CTTN HI CHSV (CAST SUPPLIES) IMPLANT
PAD CAST 4YDX4 CTTN HI CHSV (CAST SUPPLIES) ×6 IMPLANT
PEG GEMINUS SMOOTH LOCK 2.0X19 (Peg) IMPLANT
PEG SMOOTH LOCK 2.0X18 (Screw) IMPLANT
PEG SMOOTH LOCKING 20MM (Peg) IMPLANT
PLATE DVR NARROW (Plate) IMPLANT
SCREW CORT LOCK 3.5X10 TI (Screw) IMPLANT
SCREW CORT LOCK 3.5X12 TI (Screw) IMPLANT
SCREW GEMINUS CORT LOCK 3.5X11 (Screw) IMPLANT
SCREW NONLOCK POLYAXIAL 3.5X10 (Screw) IMPLANT
SCREWDRIVER SURG ST 2 (INSTRUMENTS) IMPLANT
SLING ARM FOAM STRAP MED (SOFTGOODS) IMPLANT
SOL PREP POV-IOD 4OZ 10% (MISCELLANEOUS) ×4 IMPLANT
SPIKE FLUID TRANSFER (MISCELLANEOUS) IMPLANT
SPLINT FIBERGLASS 4X30 (CAST SUPPLIES) IMPLANT
SPONGE T-LAP 4X18 ~~LOC~~+RFID (SPONGE) IMPLANT
SUT MNCRL AB 3-0 PS2 18 (SUTURE) IMPLANT
SUT MNCRL AB 4-0 PS2 18 (SUTURE) ×2 IMPLANT
SUT PROLENE 3 0 PS 2 (SUTURE) IMPLANT
SUT PROLENE 4 0 PS 2 18 (SUTURE) IMPLANT
SUT VIC AB 3-0 FS2 27 (SUTURE) IMPLANT
SUT VICRYL RAPIDE 4/0 PS 2 (SUTURE) IMPLANT
SYR CONTROL 10ML LL (SYRINGE) IMPLANT
SYSTEM CHEST DRAIN TLS 7FR (DRAIN) IMPLANT
TOWEL GREEN STERILE (TOWEL DISPOSABLE) ×2 IMPLANT
TOWEL GREEN STERILE FF (TOWEL DISPOSABLE) ×2 IMPLANT
TUBE CONNECTING 12X1/4 (SUCTIONS) ×2 IMPLANT
TUBE EVACUATION TLS (MISCELLANEOUS) ×2 IMPLANT
UNDERPAD 30X36 HEAVY ABSORB (UNDERPADS AND DIAPERS) ×2 IMPLANT
WATER STERILE IRR 1000ML POUR (IV SOLUTION) ×2 IMPLANT
WIRE FIX 1.5 STD TIP (WIRE) IMPLANT

## 2023-07-26 NOTE — Anesthesia Postprocedure Evaluation (Signed)
 Anesthesia Post Note  Patient: Gabrielle Patterson  Procedure(s) Performed: OPEN REDUCTION INTERNAL FIXATION (ORIF) DISTAL RADIUS FRACTURE, RIGHT (Right: Wrist)     Patient location during evaluation: PACU Anesthesia Type: Regional and MAC Level of consciousness: awake and alert Pain management: pain level controlled Vital Signs Assessment: post-procedure vital signs reviewed and stable Respiratory status: spontaneous breathing, nonlabored ventilation, respiratory function stable and patient connected to nasal cannula oxygen Cardiovascular status: stable and blood pressure returned to baseline Postop Assessment: no apparent nausea or vomiting Anesthetic complications: no   No notable events documented.  Last Vitals:  Vitals:   07/26/23 0915 07/26/23 0930  BP: 100/84 120/74  Pulse: 61 60  Resp: 13 20  Temp:  36.4 C  SpO2: 96% 94%    Last Pain:  Vitals:   07/26/23 0930  TempSrc:   PainSc: 0-No pain                 Thom JONELLE Peoples

## 2023-07-26 NOTE — Discharge Instructions (Signed)
 Gabrielle Patterson, M.D. Hand Surgery  POST-OPERATIVE DISCHARGE INSTRUCTIONS   PRESCRIPTIONS: You may have been given a prescription to be taken as directed for post-operative pain control.  You may also take over the counter ibuprofen/aleve and tylenol for pain. Take this as directed on the packaging. Do not exceed 3000 mg tylenol/acetaminophen in 24 hours.  Ibuprofen 600-800 mg (3-4) tablets by mouth every 6 hours as needed for pain.  OR Aleve 2 tablets by mouth every 12 hours (twice daily) as needed for pain.  AND/OR Tylenol 1000 mg (2 tablets) every 8 hours as needed for pain.  Please use your pain medication carefully, as refills are limited and you may not be provided with one.  As stated above, please use over the counter pain medicine - it will also be helpful with decreasing your swelling.    ANESTHESIA: After your surgery, post-surgical discomfort or pain is likely. This discomfort can last several days to a few weeks. At certain times of the day your discomfort may be more intense.   Did you receive a nerve block?  A nerve block can provide pain relief for one hour to two days after your surgery. As long as the nerve block is working, you will experience little or no sensation in the area the surgeon operated on.  As the nerve block wears off, you will begin to experience pain or discomfort. It is very important that you begin taking your prescribed pain medication before the nerve block fully wears off. Treating your pain at the first sign of the block wearing off will ensure your pain is better controlled and more tolerable when full-sensation returns. Do not wait until the pain is intolerable, as the medicine will be less effective. It is better to treat pain in advance than to try and catch up.   General Anesthesia:  If you did not receive a nerve block during your surgery, you will need to start taking your pain medication shortly after your surgery and should continue  to do so as prescribed by your surgeon.     ICE AND ELEVATION: You may use ice for the first 48-72 hours, but it is not critical.   Motion of your fingers is very important to decrease the swelling.  Elevation, as much as possible for the next 48 hours, is critical for decreasing swelling as well as for pain relief. Elevation means when you are seated or lying down, you hand should be at or above your heart. When walking, the hand needs to be at or above the level of your elbow.  If the bandage gets too tight, it may need to be loosened. Please contact our office and we will instruct you in how to do this.    SURGICAL BANDAGES:  Keep your dressing and/or splint clean and dry at all times.  Do not remove until you are seen again in the office.  If careful, you may place a plastic bag over your bandage and tape the end to shower, but be careful, do not get your bandages wet.     HAND THERAPY:  You will be contacted to set up your first therapy visit.    ACTIVITY AND WORK: You are encouraged to move any fingers which are not in the bandage.  Light use of the fingers is allowed to assist the other hand with daily hygiene and eating, but strong gripping or lifting is often uncomfortable and should be avoided.  You might miss a variable  period of time from work and hopefully this issue has been discussed prior to surgery. You may not do any heavy work with your affected hand for about 2 weeks.    [facility] Second Floor, [address] [place], [place] [address] [phone]

## 2023-07-26 NOTE — Op Note (Signed)
 Date of Surgery: 07/26/2023  INDICATIONS: Patient is a 63 y.o.-year-old female with a closed, right distal radius fracture.  She presents today for surgical management of her fracture.  Risks, benefits, and alternatives to surgery were again discussed with the patient in the preoperative area. The patient wishes to proceed with surgery.  Informed consent was signed after our discussion.   PREOPERATIVE DIAGNOSIS:  Right distal radius fracture  POSTOPERATIVE DIAGNOSIS: Same.  PROCEDURE:  Right brachioradialis tenotomy Open reduction and internal fixation of right distal radius fracture, extra-articular   SURGEON: Carlin Galla, M.D.  ASSIST: None  ANESTHESIA:  Regional, MAC  IV FLUIDS AND URINE: See anesthesia.  ESTIMATED BLOOD LOSS: 15 mL.  IMPLANTS:  Implant Name Type Inv. Item Serial No. Manufacturer Lot No. LRB No. Used Action  SCREW NONLOCK POLYAXIAL 3.5X10 - [id number] Screw SCREW NONLOCK POLYAXIAL 3.5X10  SKELETAL DYNAMICS  Right 1 Implanted  PEG SMOOTH LOCK 2.0X18 - [id number] Screw PEG SMOOTH LOCK 2.0X18  SKELETAL DYNAMICS  Right 2 Implanted  PEG GEMINUS SMOOTH LOCK 2.0X19 - [id number] Peg PEG GEMINUS SMOOTH LOCK 2.0X19  SKELETAL DYNAMICS  Right 3 Implanted  PEG SMOOTH LOCKING - [id number] Peg PEG SMOOTH LOCKING  SKELETAL DYNAMICS  Right 1 Implanted  PLATE DVR NARROW - [id number] Plate PLATE DVR NARROW  SKELETAL DYNAMICS  Right 1 Implanted  SCREW CORT LOCK 3.5X10 TI - [id number] Screw SCREW CORT LOCK 3.5X10 TI  SKELETAL DYNAMICS  Right 1 Implanted  SCREW GEMINUS CORT LOCK 3.5X11 - [id number] Screw SCREW GEMINUS CORT LOCK 3.5X11  SKELETAL DYNAMICS  Right 1 Implanted and Explanted  SCREW CORT LOCK 3.5X12 TI - [id number] Screw SCREW CORT LOCK 3.5X12 TI  SKELETAL DYNAMICS  Right 1 Implanted     DRAINS: None  COMPLICATIONS: None noted  DESCRIPTION OF PROCEDURE: The patient was met in the preoperative holding area where the surgical site was marked and the  consent form was signed.  The patient was then taken to the operating room and transferred to the operating table.  All bony prominences were well padded.  A tourniquet was applied to the right upper arm.  Monitored sedation was induced.  IV antibiotics were given.  The operative extremity was prepped and draped in the usual and sterile fashion.  A formal time-out was performed to confirm that this was the correct patient, surgery, side, and site.   Following formal timeout, a longitudinal incision was made directly overlying the flexor carpi radialis tendon.  The skin was incised.  Small crossing vessels were coagulated with bipolar cautery.  The volar aspect of the flexor carpi radialis tendon sheath was incised longitudinally.  The FCR tendon was retracted radially.  The floor of the FCR tendon sheath was similarly divided using a 15 blade scalpel.  Blunt dissection was used to develop Parona space between the flexor pollicis longus and pronator quadratus.  The flexor pollicis longus was retracted ulnarly.  An L-shaped tenotomy was performed along the distal and radial aspect of the pronator quadratus tendon.  The pronator quadratus was subperiosteally dissected off of the volar aspect of the radius and the distal fracture fragments using an elevator.  Blunt dissection was used to identify the brachioradialis tendon as it inserted on the radial aspect of the distal radius.  The underlying first dorsal compartment tendons were identified and protected.  A tenotomy of the brachioradialis was performed to aid in reduction of the fracture.  Reduction of the fracture was achieved using a Therapist, nutritional to lever  the fragment into a reduced position.  The fracture appeared to be anatomic.  A 3 hole narrow plate was selected and placed at the volar surface of the radius.  AP view showed the plate was in appropriate fit.  A 3.5 mm cortical screw was placed in the center of the oblong hole.  The fracture was reduced.  2  K wires were placed and the aiming guides of the plate in the radial and ulnar aspects.  AP and lateral view showed appropriate K wire position.  The wires were in a good, subchondral location.  The distal end of the plate was then filled with unicortical smooth locking pegs.  The remaining holes in the shaft were then filled with bicortical locking screws.  AP, lateral, and 30 degree lateral view showed the fracture was anatomically reduced with restoration of anatomic radial height, inclination, and volar tilt.  The plate was in a good position.  All the distal locking screws were in good, subchondral location.  The wound was then thoroughly irrigated copious 0 saline.  The pronator quadratus was repaired using a 3-0 Monocryl suture.  The skin was then closed in a subcutaneous fashion using 3-0 Monocryl.  The tourniquet was deflated.  Hemostasis was achieved with direct pressure of the wound as well as with bipolar cautery.  The skin was then closed in a horizontal mattress fashion using a 4-0 Vicryl Rapide suture.  The skin was then cleaned and dressed with Xeroform, folded Kerlix, cast padding, and a well-padded volar splint was applied.   The patient was reversed from sedation.  They were transferred from the operating table to the postoperative bed.  All counts were correct x 2 at the end of the procedure.  The patient was then taken to the PACU in stable condition.   POSTOPERATIVE PLAN: She will be discharged home with appropriate pain medication and discharge instructions.  A referral will be placed to hand therapy.  I will see her back in 10 to 14 days for her first postop visit.  Carlin Galla, MD 8:55 AM

## 2023-07-26 NOTE — Interval H&P Note (Signed)
 History and Physical Interval Note:  07/26/2023 7:35 AM  Gabrielle Patterson  has presented today for surgery, with the diagnosis of Right distal radius fracture.  The various methods of treatment have been discussed with the patient and family. After consideration of risks, benefits and other options for treatment, the patient has consented to  Procedure(s): OPEN REDUCTION INTERNAL FIXATION (ORIF) DISTAL RADIUS FRACTURE (Right) as a surgical intervention.  The patient's history has been reviewed, patient examined, no change in status, stable for surgery.  I have reviewed the patient's chart and labs.  Questions were answered to the patient's satisfaction.     Christmas Faraci

## 2023-07-26 NOTE — Transfer of Care (Cosign Needed)
 Immediate Anesthesia Transfer of Care Note  Patient: Gabrielle Patterson  Procedure(s) Performed: OPEN REDUCTION INTERNAL FIXATION (ORIF) DISTAL RADIUS FRACTURE, RIGHT (Right: Wrist)  Patient Location: PACU  Anesthesia Type:MAC  Level of Consciousness: awake, alert , and oriented  Airway & Oxygen Therapy: Patient Spontanous Breathing and Patient connected to face mask oxygen  Post-op Assessment: Report given to RN and Post -op Vital signs reviewed and stable  Post vital signs: Reviewed and stable  Last Vitals:  Vitals Value Taken Time  BP 109/65 07/26/23 09:00  Temp    Pulse 62 07/26/23 09:02  Resp 17 07/26/23 09:02  SpO2 97 % 07/26/23 09:02  Vitals shown include unfiled device data.  Last Pain:  Vitals:   07/26/23 0622  TempSrc:   PainSc: 0-No pain         Complications: No notable events documented.

## 2023-07-26 NOTE — OR Nursing (Signed)
 ACCESSION NUMBER FOR [facility]I [facility] [id number] FROM ORDER [id number]

## 2023-07-26 NOTE — H&P (Signed)
 HAND SURGERY   HPI: Patient is a 63 y.o. female who presents with a right distal radius fracture.  She presents today for open reduction and internal fixation.  Patient denies any changes to their medical history or new systemic symptoms today.    Past Medical History:  Diagnosis Date   Allergic rhinitis    Allergy    Bell's palsy 2024   Complication of anesthesia    difficulty waking up   COVID    2021, 2022   Diverticulosis    History of cataract removal with insertion of prosthetic lens 2019   Bilateral   Hyperlipidemia    Multiple sclerosis (HCC)    Urine incontinence    Vitamin D  deficiency    Past Surgical History:  Procedure Laterality Date   BLADDER SUSPENSION N/A 08/08/2017   Procedure: TRANSVAGINAL TAPE (TVT) PROCEDURE exact midurethral sling;  Surgeon: Cathlyn JAYSON Nikki Bobie FORBES, MD;  Location: [facility];  Service: Gynecology;  Laterality: N/A;   BREAST SURGERY  11/2006   Rt.breast Bx--Florid fibrocystic changes--high risk but negative for neoplasm   CYSTOSCOPY N/A 08/08/2017   Procedure: CYSTOSCOPY;  Surgeon: Cathlyn JAYSON Nikki Bobie FORBES, MD;  Location: [facility];  Service: Gynecology;  Laterality: N/A;   ENDOMETRIAL ABLATION  03/07/2005   -HTA   ENDOMETRIAL BIOPSY  12/15/2004   --benign endocervical polyp--Dr. 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   TUBAL LIGATION     Social History   Socioeconomic History   Marital status: Married    Spouse name: Not on file   Number of children: 2   Years of education: Not on file   Highest education level: Not on file  Occupational History   Not on file  Tobacco Use   Smoking status: Former   Smokeless tobacco: Never  Vaping  Use   Vaping status: Never Used  Substance and Sexual Activity   Alcohol use: Yes    Comment: occ   Drug use: No   Sexual activity: Yes    Partners: Male    Birth control/protection: Post-menopausal  Other Topics Concern   Not on file  Social History Narrative   Not on file   Social Drivers of Health   Financial Resource Strain: Not on file  Food Insecurity: Not on file  Transportation Needs: Not on file  Physical Activity: Not on file  Stress: Not on file  Social Connections: Not on file   Family History  Problem Relation Age of Onset   Diabetes Mother    Hypertension Mother    Hyperlipidemia Mother    Cancer Mother 45       pancreatic ca   Diabetes Father    Hypertension Father    Stroke Father    Hearing loss Father    Heart attack Father    Kidney disease Father    Thyroid  disease Sister    Breast cancer Maternal Grandmother    Cancer Maternal Grandmother    Ovarian cancer Paternal Grandmother    Diabetes Paternal Grandmother    Cancer Paternal Grandmother    - negative except otherwise stated in the family history section Allergies  Allergen Reactions   Penicillins Hives   Sulfa Antibiotics  Rash   Prior to Admission medications   Medication Sig Start Date End Date Taking? Authorizing Provider  acetaminophen  (TYLENOL ) 500 MG tablet Take 1,000 mg by mouth every 6 (six) hours as needed (pain.).   Yes [provider]   No results found. - Positive ROS: All other systems have been reviewed and were otherwise negative with the exception of those mentioned in the HPI and as above.  Physical Exam: General: No acute distress, resting comfortably Cardiovascular: BUE warm and well perfused, normal rate Respiratory: Normal WOB on RA Skin: Warm and dry Neurologic: Sensation intact distally Psychiatric: Patient is at baseline mood and affect  Right Upper Extremity  Splint is clean and dry.  Limited AROM of fingers secondary to pain and swelling.   SILT m/u/r distributions. Fingers pink and well perfused.   Assessment: 63 yo F w/ right distal radius fracture  Plan: OR today for ORIF right distal radius fracture. We again reviewed the risks of surgery which include bleeding, infection, damage to neurovascular structures, persistent symptoms, nonunion, malunion, delayed wound healing, complex regional pain syndrome, need for additional surgery.   All questions were answered.   Bebe Galla, M.D. [facility] 6:51 AM

## 2023-07-28 ENCOUNTER — Encounter (HOSPITAL_COMMUNITY): Payer: Self-pay | Admitting: Orthopedic Surgery

## 2023-08-08 DIAGNOSIS — M25631 Stiffness of right wrist, not elsewhere classified: Secondary | ICD-10-CM | POA: Diagnosis not present

## 2023-08-08 DIAGNOSIS — M25641 Stiffness of right hand, not elsewhere classified: Secondary | ICD-10-CM | POA: Diagnosis not present

## 2023-08-08 DIAGNOSIS — S62101D Fracture of unspecified carpal bone, right wrist, subsequent encounter for fracture with routine healing: Secondary | ICD-10-CM | POA: Diagnosis not present

## 2023-08-15 DIAGNOSIS — M25641 Stiffness of right hand, not elsewhere classified: Secondary | ICD-10-CM | POA: Diagnosis not present

## 2023-08-15 DIAGNOSIS — M25631 Stiffness of right wrist, not elsewhere classified: Secondary | ICD-10-CM | POA: Diagnosis not present

## 2023-08-22 DIAGNOSIS — M25641 Stiffness of right hand, not elsewhere classified: Secondary | ICD-10-CM | POA: Diagnosis not present

## 2023-08-22 DIAGNOSIS — M25631 Stiffness of right wrist, not elsewhere classified: Secondary | ICD-10-CM | POA: Diagnosis not present

## 2023-08-30 ENCOUNTER — Ambulatory Visit: Admitting: Podiatry

## 2023-08-30 ENCOUNTER — Encounter: Payer: Self-pay | Admitting: Podiatry

## 2023-08-30 DIAGNOSIS — B351 Tinea unguium: Secondary | ICD-10-CM | POA: Diagnosis not present

## 2023-08-30 DIAGNOSIS — M25631 Stiffness of right wrist, not elsewhere classified: Secondary | ICD-10-CM | POA: Diagnosis not present

## 2023-08-30 DIAGNOSIS — M25641 Stiffness of right hand, not elsewhere classified: Secondary | ICD-10-CM | POA: Diagnosis not present

## 2023-08-30 NOTE — Progress Notes (Signed)
  Subjective:  Patient ID: Gabrielle Patterson, female    DOB: 1960/08/14,   MRN: [id number]  Chief Complaint  Patient presents with   Nail Problem    I got toe fungus.    63 y.o. female presents for concern of fungus on several of her toes mostly the right great and fourth and fifth. Relates this has been ongoing for two years and started after going to a salon. She has tried vicks and some other topicals. She is interested in laser treatment    . Denies any other pedal complaints. Denies n/v/f/c.   Past Medical History:  Diagnosis Date   Allergic rhinitis    Allergy    Bell's palsy 2024   Complication of anesthesia    difficulty waking up   COVID    2021, 2022   Diverticulosis    History of cataract removal with insertion of prosthetic lens 2019   Bilateral   Hyperlipidemia    Multiple sclerosis (HCC)    Urine incontinence    Vitamin D  deficiency     Objective:  Physical Exam: Vascular: DP/PT pulses 2/4 bilateral. CFT <3 seconds. Normal hair growth on digits. No edema.  Skin. No lacerations or abrasions bilateral feet. Right hallux and fourth and fifth digit bilateral are thickened and dystrophic with subungual debris.  Musculoskeletal: MMT 5/5 bilateral lower extremities in DF, PF, Inversion and Eversion. Deceased ROM in DF of ankle joint.  Neurological: Sensation intact to light touch.   Assessment:   1. Onychomycosis      Plan:  Patient was evaluated and treated and all questions answered. -Examined patient -Discussed treatment options for painful dystrophic nails  -Clinical picture and Fungal culture was obtained by removing a portion of the hard nail itself from each of the involved toenails using a sterile nail nipper and sent to [facility] lab. Patient tolerated the biopsy procedure well without discomfort or need for anesthesia.  -Discussed fungal nail treatment options including oral, topical, and laser treatments.  -Patient to return in 4 weeks for follow up  evaluation and discussion of fungal culture results or sooner if symptoms worsen.   Asberry Failing, DPM

## 2023-08-31 NOTE — Addendum Note (Signed)
 Addended by: WAYLAN ELIDIA PARAS on: 08/31/2023 04:46 PM   Modules accepted: Orders

## 2023-09-06 ENCOUNTER — Other Ambulatory Visit: Payer: Self-pay | Admitting: Podiatry

## 2023-09-06 DIAGNOSIS — M25631 Stiffness of right wrist, not elsewhere classified: Secondary | ICD-10-CM | POA: Diagnosis not present

## 2023-09-06 DIAGNOSIS — M25641 Stiffness of right hand, not elsewhere classified: Secondary | ICD-10-CM | POA: Diagnosis not present

## 2023-09-08 DIAGNOSIS — S62101D Fracture of unspecified carpal bone, right wrist, subsequent encounter for fracture with routine healing: Secondary | ICD-10-CM | POA: Diagnosis not present

## 2023-09-14 DIAGNOSIS — M25641 Stiffness of right hand, not elsewhere classified: Secondary | ICD-10-CM | POA: Diagnosis not present

## 2023-09-14 DIAGNOSIS — M25631 Stiffness of right wrist, not elsewhere classified: Secondary | ICD-10-CM | POA: Diagnosis not present

## 2023-09-18 DIAGNOSIS — M25631 Stiffness of right wrist, not elsewhere classified: Secondary | ICD-10-CM | POA: Diagnosis not present

## 2023-09-18 DIAGNOSIS — M25641 Stiffness of right hand, not elsewhere classified: Secondary | ICD-10-CM | POA: Diagnosis not present

## 2023-09-22 DIAGNOSIS — Z1231 Encounter for screening mammogram for malignant neoplasm of breast: Secondary | ICD-10-CM | POA: Diagnosis not present

## 2023-09-22 LAB — HM MAMMOGRAPHY

## 2023-09-27 ENCOUNTER — Encounter: Payer: Self-pay | Admitting: Family Medicine

## 2023-09-28 DIAGNOSIS — M25641 Stiffness of right hand, not elsewhere classified: Secondary | ICD-10-CM | POA: Diagnosis not present

## 2023-09-28 DIAGNOSIS — M25631 Stiffness of right wrist, not elsewhere classified: Secondary | ICD-10-CM | POA: Diagnosis not present

## 2023-10-04 ENCOUNTER — Ambulatory Visit: Payer: Self-pay | Admitting: Podiatry

## 2023-10-05 DIAGNOSIS — M25631 Stiffness of right wrist, not elsewhere classified: Secondary | ICD-10-CM | POA: Diagnosis not present

## 2023-10-10 DIAGNOSIS — M25631 Stiffness of right wrist, not elsewhere classified: Secondary | ICD-10-CM | POA: Diagnosis not present

## 2023-10-10 DIAGNOSIS — S62101D Fracture of unspecified carpal bone, right wrist, subsequent encounter for fracture with routine healing: Secondary | ICD-10-CM | POA: Diagnosis not present

## 2023-10-11 ENCOUNTER — Ambulatory Visit: Admitting: Podiatry

## 2023-10-11 DIAGNOSIS — B351 Tinea unguium: Secondary | ICD-10-CM

## 2023-10-11 NOTE — Progress Notes (Signed)
  Subjective:  Patient ID: Gabrielle Patterson, female    DOB: July 14, 1960,   MRN: [id number]  Chief Complaint  Patient presents with   Nail Problem    I want laser treatments for my fungus.    63 y.o. female presents for follow-up of nail fungus and to discuss culture results.  . Denies any other pedal complaints. Denies n/v/f/c.   Past Medical History:  Diagnosis Date   Allergic rhinitis    Allergy    Bell's palsy 2024   Complication of anesthesia    difficulty waking up   COVID    2021, 2022   Diverticulosis    History of cataract removal with insertion of prosthetic lens 2019   Bilateral   Hyperlipidemia    Multiple sclerosis    Urine incontinence    Vitamin D  deficiency     Objective:  Physical Exam: Vascular: DP/PT pulses 2/4 bilateral. CFT <3 seconds. Normal hair growth on digits. No edema.  Skin. No lacerations or abrasions bilateral feet. Right hallux and fourth and fifth digit bilateral are thickened and dystrophic with subungual debris.  Musculoskeletal: MMT 5/5 bilateral lower extremities in DF, PF, Inversion and Eversion. Deceased ROM in DF of ankle joint.  Neurological: Sensation intact to light touch.   Assessment:   1. Onychomycosis      Plan:  Patient was evaluated and treated and all questions answered. -Examined patient -Discussed treatment options for painful dystrophic nails  -Culture positive for t rubum.   -Discussed fungal nail treatment options including oral, topical, and laser treatments.  -Patient would like to try laser nail treatments.  -Patient to return in 3 months for recheck.   Asberry Failing, DPM

## 2023-10-17 DIAGNOSIS — M25631 Stiffness of right wrist, not elsewhere classified: Secondary | ICD-10-CM | POA: Diagnosis not present

## 2023-10-24 DIAGNOSIS — M25641 Stiffness of right hand, not elsewhere classified: Secondary | ICD-10-CM | POA: Diagnosis not present

## 2023-10-24 DIAGNOSIS — M25631 Stiffness of right wrist, not elsewhere classified: Secondary | ICD-10-CM | POA: Diagnosis not present

## 2023-11-24 ENCOUNTER — Ambulatory Visit (INDEPENDENT_AMBULATORY_CARE_PROVIDER_SITE_OTHER): Payer: Self-pay | Admitting: Podiatrist

## 2023-11-24 DIAGNOSIS — B351 Tinea unguium: Secondary | ICD-10-CM

## 2023-11-24 NOTE — Progress Notes (Signed)
 Patient presents today for the laser treatment # 1. Diagnosed with mycotic nail infection by Dr. Joya Done most affected are Right hallux and right third toenail and bilateral 4th and 5th nails.   All other systems are negative.  Patients nails were filed thin using a sterile burr.    Laser therapy via [facility] Laser therapy system was admininstered to affected toenails .  The Patient tolerated the treatment well. All safety precautions were in place.   Follow up in 4 weeks for laser # 2  Pre treatment photos below-

## 2023-12-22 ENCOUNTER — Ambulatory Visit

## 2023-12-22 DIAGNOSIS — B351 Tinea unguium: Secondary | ICD-10-CM

## 2023-12-22 NOTE — Progress Notes (Signed)
 Patient presents today for the 2nd laser treatment. Diagnosed with mycotic nail infection by Dr. Sikora.   Toenail most affected right hallux, right third toe nail and bilateral 4th and 5th toe nails.  All other systems are negative.  Nails were filed thin. Laser therapy was administered to affected toenails. Patient tolerated the treatment well. All safety precautions were in place.   Post treatment instructions reviewed and provided to patient. Patient had no questions regarding plan of care.   Follow up in 4 weeks for laser # 3.

## 2024-01-06 ENCOUNTER — Emergency Department: Admission: EM | Admit: 2024-01-06 | Discharge: 2024-01-06 | Disposition: A | Discharge status: Home / Self Care

## 2024-01-19 ENCOUNTER — Other Ambulatory Visit

## 2024-01-26 ENCOUNTER — Ambulatory Visit (INDEPENDENT_AMBULATORY_CARE_PROVIDER_SITE_OTHER)

## 2024-01-26 DIAGNOSIS — B351 Tinea unguium: Secondary | ICD-10-CM

## 2024-01-26 NOTE — Progress Notes (Signed)
 Patient presents today for the 3rd laser treatment. Diagnosed with mycotic nail infection by Dr. Sikora.   Toenails most affected right 1st-5th and left 4th-5th..  All other systems are negative.  Nails were filed thin. Laser therapy was administered to right 1st-5th and left 4th-5th toenails  and patient tolerated the treatment well. All safety precautions were in place.   Post treatment instructions reviewed and provided to patient. Patient had no questions regarding plan of care.   Follow up in 6 weeks for laser # 4.

## 2024-01-31 ENCOUNTER — Ambulatory Visit: Admitting: Podiatry

## 2024-03-12 ENCOUNTER — Ambulatory Visit
